# Patient Record
Sex: Female | Born: 1960 | Race: Black or African American | Hispanic: No | Marital: Married | Smoking: Never smoker
Health system: Southern US, Community
[De-identification: ages and names within clinical notes are randomized; demographics above are authoritative.]

## PROBLEM LIST (undated history)

## (undated) DIAGNOSIS — G43909 Migraine, unspecified, not intractable, without status migrainosus: Secondary | ICD-10-CM

## (undated) DIAGNOSIS — R112 Nausea with vomiting, unspecified: Secondary | ICD-10-CM

## (undated) DIAGNOSIS — Z9889 Other specified postprocedural states: Secondary | ICD-10-CM

## (undated) DIAGNOSIS — B369 Superficial mycosis, unspecified: Secondary | ICD-10-CM

## (undated) DIAGNOSIS — M1990 Unspecified osteoarthritis, unspecified site: Secondary | ICD-10-CM

## (undated) DIAGNOSIS — Z973 Presence of spectacles and contact lenses: Secondary | ICD-10-CM

## (undated) DIAGNOSIS — S83206A Unspecified tear of unspecified meniscus, current injury, right knee, initial encounter: Secondary | ICD-10-CM

## (undated) HISTORY — DX: Unspecified osteoarthritis, unspecified site: M19.90

## (undated) HISTORY — DX: Superficial mycosis, unspecified: B36.9

## (undated) HISTORY — DX: Migraine, unspecified, not intractable, without status migrainosus: G43.909

---

## 1992-11-26 HISTORY — PX: CHOLECYSTECTOMY: SHX55

## 1993-11-26 HISTORY — PX: KNEE ARTHROSCOPY: SUR90

## 2001-07-11 ENCOUNTER — Ambulatory Visit (HOSPITAL_COMMUNITY): Admission: RE | Admit: 2001-07-11 | Discharge: 2001-07-11 | Payer: Self-pay | Admitting: General Surgery

## 2001-07-11 ENCOUNTER — Encounter: Payer: Self-pay | Admitting: General Surgery

## 2001-11-13 ENCOUNTER — Encounter: Payer: Self-pay | Admitting: Obstetrics and Gynecology

## 2001-11-13 ENCOUNTER — Ambulatory Visit (HOSPITAL_COMMUNITY): Admission: RE | Admit: 2001-11-13 | Discharge: 2001-11-13 | Payer: Self-pay | Admitting: Obstetrics and Gynecology

## 2001-11-20 ENCOUNTER — Encounter: Payer: Self-pay | Admitting: Family Medicine

## 2001-11-20 ENCOUNTER — Ambulatory Visit (HOSPITAL_COMMUNITY): Admission: RE | Admit: 2001-11-20 | Discharge: 2001-11-20 | Payer: Self-pay | Admitting: Family Medicine

## 2002-01-27 ENCOUNTER — Other Ambulatory Visit: Admission: RE | Admit: 2002-01-27 | Discharge: 2002-01-27 | Payer: Self-pay | Admitting: Obstetrics and Gynecology

## 2002-03-26 ENCOUNTER — Encounter: Payer: Self-pay | Admitting: Family Medicine

## 2002-03-26 ENCOUNTER — Ambulatory Visit (HOSPITAL_COMMUNITY): Admission: RE | Admit: 2002-03-26 | Discharge: 2002-03-26 | Payer: Self-pay | Admitting: Family Medicine

## 2002-11-16 ENCOUNTER — Ambulatory Visit (HOSPITAL_COMMUNITY): Admission: RE | Admit: 2002-11-16 | Discharge: 2002-11-16 | Payer: Self-pay | Admitting: Obstetrics and Gynecology

## 2002-11-16 ENCOUNTER — Encounter: Payer: Self-pay | Admitting: Obstetrics and Gynecology

## 2003-07-30 ENCOUNTER — Ambulatory Visit (HOSPITAL_COMMUNITY): Admission: RE | Admit: 2003-07-30 | Discharge: 2003-07-30 | Payer: Self-pay | Admitting: Obstetrics & Gynecology

## 2003-07-30 HISTORY — PX: TUBAL LIGATION: SHX77

## 2003-11-18 ENCOUNTER — Ambulatory Visit (HOSPITAL_COMMUNITY): Admission: RE | Admit: 2003-11-18 | Discharge: 2003-11-18 | Payer: Self-pay | Admitting: Obstetrics and Gynecology

## 2004-11-21 ENCOUNTER — Ambulatory Visit (HOSPITAL_COMMUNITY): Admission: RE | Admit: 2004-11-21 | Discharge: 2004-11-21 | Payer: Self-pay | Admitting: Obstetrics and Gynecology

## 2006-02-07 ENCOUNTER — Ambulatory Visit (HOSPITAL_COMMUNITY): Admission: RE | Admit: 2006-02-07 | Discharge: 2006-02-07 | Payer: Self-pay | Admitting: Obstetrics and Gynecology

## 2006-11-26 HISTORY — PX: SHOULDER OPEN ROTATOR CUFF REPAIR: SHX2407

## 2007-06-13 ENCOUNTER — Ambulatory Visit (HOSPITAL_COMMUNITY): Admission: RE | Admit: 2007-06-13 | Discharge: 2007-06-13 | Payer: Self-pay | Admitting: Obstetrics & Gynecology

## 2007-06-19 ENCOUNTER — Ambulatory Visit: Payer: Self-pay | Admitting: Gastroenterology

## 2007-06-20 ENCOUNTER — Ambulatory Visit (HOSPITAL_COMMUNITY): Admission: RE | Admit: 2007-06-20 | Discharge: 2007-06-20 | Payer: Self-pay | Admitting: Gastroenterology

## 2007-08-27 ENCOUNTER — Ambulatory Visit: Payer: Self-pay | Admitting: Gastroenterology

## 2008-04-26 ENCOUNTER — Encounter (HOSPITAL_COMMUNITY): Admission: RE | Admit: 2008-04-26 | Discharge: 2008-05-26 | Payer: Self-pay | Admitting: Orthopedic Surgery

## 2008-05-31 ENCOUNTER — Encounter (HOSPITAL_COMMUNITY): Admission: RE | Admit: 2008-05-31 | Discharge: 2008-06-30 | Payer: Self-pay | Admitting: Orthopedic Surgery

## 2008-07-05 ENCOUNTER — Encounter (HOSPITAL_COMMUNITY): Admission: RE | Admit: 2008-07-05 | Discharge: 2008-08-04 | Payer: Self-pay | Admitting: Orthopedic Surgery

## 2008-08-05 ENCOUNTER — Encounter (HOSPITAL_COMMUNITY): Admission: RE | Admit: 2008-08-05 | Discharge: 2008-08-25 | Payer: Self-pay | Admitting: Orthopedic Surgery

## 2008-08-26 ENCOUNTER — Encounter (HOSPITAL_COMMUNITY): Admission: RE | Admit: 2008-08-26 | Discharge: 2008-09-25 | Payer: Self-pay | Admitting: Orthopedic Surgery

## 2008-09-29 ENCOUNTER — Ambulatory Visit (HOSPITAL_COMMUNITY): Admission: RE | Admit: 2008-09-29 | Discharge: 2008-09-29 | Payer: Self-pay | Admitting: Obstetrics and Gynecology

## 2008-12-29 ENCOUNTER — Other Ambulatory Visit: Admission: RE | Admit: 2008-12-29 | Discharge: 2008-12-29 | Payer: Self-pay | Admitting: Obstetrics and Gynecology

## 2009-09-29 ENCOUNTER — Encounter: Payer: Self-pay | Admitting: Family Medicine

## 2010-01-16 ENCOUNTER — Other Ambulatory Visit: Admission: RE | Admit: 2010-01-16 | Discharge: 2010-01-16 | Payer: Self-pay | Admitting: Obstetrics and Gynecology

## 2010-05-16 ENCOUNTER — Encounter: Payer: Self-pay | Admitting: Internal Medicine

## 2010-05-30 ENCOUNTER — Ambulatory Visit (HOSPITAL_COMMUNITY): Admission: RE | Admit: 2010-05-30 | Discharge: 2010-05-30 | Payer: Self-pay | Admitting: Internal Medicine

## 2010-05-30 ENCOUNTER — Ambulatory Visit: Payer: Self-pay | Admitting: Internal Medicine

## 2010-06-04 ENCOUNTER — Encounter: Payer: Self-pay | Admitting: Internal Medicine

## 2010-06-27 ENCOUNTER — Encounter: Payer: Self-pay | Admitting: Internal Medicine

## 2010-12-26 NOTE — Letter (Signed)
Summary: PATHOLOGY RESULTS  PATHOLOGY RESULTS   Imported By: Rexene Alberts 06/27/2010 16:04:09  _____________________________________________________________________  External Attachment:    Type:   Image     Comment:   External Document

## 2010-12-26 NOTE — Letter (Signed)
Summary: Patient Notice, Colon Biopsy Results  [facility]  [address]   [place], [place] [address]   Phone: [phone]  Fax: [phone]       June 04, 2010   Charlotte Rice [address] [place], [place]  [address] 1961-04-24    Dear Ms. Mullan,  I am pleased to inform you that the biopsies taken during your recent colonoscopy did not show any evidence of cancer upon pathologic examination.  Additional information/recommendations:  No further action is needed at this time.  Please follow-up with your primary care physician for your other healthcare needs.  You should have a repeat colonoscopy examination  in 10 years.  Please call us if you are having persistent problems or have questions about your condition that have not been fully answered at this time.  Sincerely,    R. Roetta Sessions MD, FACP [facility] Ph: [phone]    Fax: [phone]   Appended Document: Patient Notice, Colon Biopsy Results Letter mailed to pt.  Appended Document: Patient Notice, Colon Biopsy Results reminder in computer

## 2010-12-26 NOTE — Letter (Signed)
Summary: Internal Other Domingo Dimes  Internal Other Domingo Dimes   Imported By: Cloria Spring LPN 52/84/1324 40:10:27  _____________________________________________________________________  External Attachment:    Type:   Image     Comment:   External Document

## 2011-04-10 NOTE — Assessment & Plan Note (Signed)
NAMEMarland Kitchen  Charlotte Rice, Charlotte Rice               CHART#:  [id number]   DATE:  08/27/2007                       DOB:  15-May-1961   REFERRING PHYSICIAN:  Christin Bach, M.D.   PROBLEM LIST:  1. Recurrent right upper quadrant pain.  2. Cholecystectomy in 1991.  3. Right shoulder pain.   SUBJECTIVE:  Charlotte Rice is a 50 year old female who was seen and  evaluated in July 2008 secondary to right upper quadrant pain.  She  presents today with no complaints of nausea, vomiting, diarrhea, or  constipation.  She has one bowel movement a day.  She does use over-the-  counter Advil 3 to 4 times a day for right shoulder pain.  She denies  any rectal bleeding or black, tarry stools.  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  She describes the pain that occurs in her right abdomen as an onset of a  bad cramp that is exacerbated by certain jobs or moves that she makes.  She reports doing a lot of bending and lifting and picking up stuff.   OBJECTIVE:  VITAL SIGNS:  Weight 181 pounds (increased 5 pounds since  July 2008).  Height 5 feet.  Temperature 98.2.  Blood pressure 118/60.  Pulse 72.  GENERAL:  She is in no apparent distress, alert and oriented x4.  LUNGS:  Clear to auscultation bilaterally.  CARDIOVASCULAR:  Regular rhythm without murmur.  ABDOMEN:  Bowel sounds are present, soft, reproducible pain in the right  flank over her ribs.  No rebound or guarding in the abdomen.   ASSESSMENT:  Charlotte Rice is a 50 year old female whose right flank pain  is likely secondary to abdominal wall pain.  Her symptoms can be  reproduced on exam in clinic today.   Thank you for allowing me to see Charlotte Rice in consultation.  My  recommendations follow.   RECOMMENDATIONS:  1. Charlotte Rice may follow up with [place] as needed.  2. She may continue to use Advil as needed for her musculoskeletal      pain.  She may also      apply heat 3 times a day.  Her apparent  right chest wall strain      will probably persist due to the nature of the work that she does.       Kassie Mends, M.D.  Electronically Signed     SM/MEDQ  D:  08/27/2007  T:  08/28/2007  Job:  [id number]   cc:   Tilda Burrow, M.D.

## 2011-04-10 NOTE — Assessment & Plan Note (Signed)
Charlotte Rice, Charlotte Rice               CHART#:  [id number]   DATE:  06/19/2007                       DOB:  1960-12-08   REQUESTING PHYSICIAN:  Dr. Emelda Fear.   REASON FOR CONSULTATION:  Possible cholelithiasis/right upper quadrant  pain.   HISTORY OF PRESENT ILLNESS:  The patient is a 50 year old female who is  status post cholecystectomy approximately 9 years ago by Dr. Leona Carry.  She tells me, for a couple of months, she has had intermittent right  upper quadrant pain.  The pain is usually 7/10 on a pain scale.  It  lasts a few minutes.  She describes it like a muscle cramp.  The pain  is worse with movement.  It is definitely worse postprandially.  She  denies any nausea or vomiting.  Denies any anorexia or early satiety.  Denies any fevers or chills, or weight loss.  She denies any dark urine.  Denies any pruritus or jaundice.  She denies any dysphagia or  odynophagia, heartburn, or indigestion.  Her weight has remained stable.   She underwent abdominal ultrasound on 06/13/2007.  This study suggests  the presence of a 5 mm CBD stone with intra and extrahepatic biliary  ductal dilatation.  The CBD is not dilated in the head of the pancreas.  The wall of the duodenum adjacent to the head of the pancreas appears  slightly thickened, but is nonspecific.  Her LFTs from 06/11/2007 were  completely normal.  She also had a normal CBC and comprehensive  metabolic panel.   PAST MEDICAL/SURGICAL HISTORY:  Cholecystectomy in 1999.  She is unsure  whether she had cholelithiasis.  Right knee surgery in 1985.   CURRENT MEDICATIONS:  Topamax 150 mg nightly.  Loestrin 24 mcg FE daily.  Tramadol HCl 50 mg daily.  Etodolac ER 600 mg b.i.d.  Advil occasionally  p.r.n.   ALLERGIES:  No known drug allergies.   FAMILY HISTORY:  Noncontributory.   SOCIAL HISTORY:  The patient has 2 children ages 48 and 60.  She is a  Print production planner.  She denies tobacco, alcohol, or drug use.   REVIEW  OF SYSTEMS:  See HPI.  Otherwise, negative.   PHYSICAL EXAM:  VITAL SIGNS:  Weight 176 pounds, height 60 inches,  temperature 98.5, blood pressure 160/60, and pulse 66.  GENERAL:  The patient is a 50 year old African-American female who is  awake and alert, oriented, pleasant, and cooperative, in no acute  distress.  HEENT:  Pupils equal, round, and reactive to light.  Sclerae clear,  nonicteric.  Conjunctivae pink.  Oropharynx pink and moist without  lesions.  NECK:  Supple without evidence of mass or thyromegaly.  CHEST:  Heart regular rate and rhythm.  Normal S1, S2 without murmurs,  clicks, rubs, or gallops.  LUNGS:  Clear to auscultation bilaterally.  ABDOMEN:  Positive bowel sounds x4.  No bruits auscultated.  Soft.  Nontender, nondistended without palpable mass or hepatosplenomegaly.  No  rebound tenderness or guarding.  EXTREMITIES:  Without clubbing or edema bilaterally.  SKIN:  Warm and dry without any rash or jaundice.   IMPRESSION:  The patient is a 50 year old female with a couple-month  history of intermittent right upper quadrant pain.  Abdominal ultrasound  form 06/13/2007 shows possible presence of a 5 mm common bile duct stone  with intra  and extrahepatic biliary ductal dilatation, but there is no  elevation of LFT.  She is going to need further evaluation with MRCP to  evaluate whether she truly has choledocholithiasis, and in that event,  she would need ERCP.   PLAN:  1. MRCP in the near future.  2. I have offered pain medication.  She has declined.  3. Further recommendations pending MRCP.   We would like to thank Dr. Emelda Fear for allowing [place] to participate in  the care of this patient.       Lorenza Burton, N.P.  Electronically Signed     Kassie Mends, M.D.  Electronically Signed    KJ/MEDQ  D:  06/20/2007  T:  06/20/2007  Job:  [id number]   cc:   Tilda Burrow, M.D.

## 2011-04-13 NOTE — Op Note (Signed)
   NAME:  Charlotte Rice, Charlotte Rice                        ACCOUNT NO.:  [id number]   MEDICAL RECORD NO.:  [id number]                   PATIENT TYPE:  AMB   LOCATION:  DAY                                  FACILITY:  [facility]   PHYSICIAN:  Lazaro Arms, M.D.                DATE OF BIRTH:  1961/10/31   DATE OF PROCEDURE:  07/30/2003  DATE OF DISCHARGE:                                 OPERATIVE REPORT   PREOPERATIVE DIAGNOSES:  1. Parous female desires permanent sterilization.  2. Irregular bleeding on Micronor.   POSTOPERATIVE DIAGNOSES:  1. Parous female desires permanent sterilization.  2. Irregular bleeding on Micronor.   PROCEDURE:  Laparoscopic bilateral tubal ligation using electrocautery.   SURGEON:  Lazaro Arms, M.D.   ANESTHESIA:  General endotracheal.   FINDINGS:  The patient had normal uterus, tubes, and ovaries.  There were no  intraperitoneal abnormalities.   DESCRIPTION OF PROCEDURE:  The patient was taken to the operating room and  placed in the supine position.  She underwent general endotracheal  anesthesia and placed in the low lithotomy position.  The abdomen and vagina  were prepped in the usual sterile fashion.  A Hulka tenaculum was placed.  She was then draped.   An open laparoscopy was performed under direct visualization.  An incision  was made in the umbilicus and carried down to the fascia.  The fascia was  incised and the peritoneum was entered bluntly.  Under direct visualization,  a 10-11 obturator was placed.  It was just placed with nothing in it, and  then the video laparoscope confirmed the peritoneal cavity.  It was  insufflated.  Bilateral tubal ligation was performed using the  electrocautery unit in the distal isthmic ampullary region of each portion  of the tube.  There was good hemostasis.  An approximately 3-cm segment  bilaterally was cauterized.  The instruments were removed.  The gas was  allowed to escape.  The fascia was closed with a  single 0 Vicryl suture.  A  3-0 subcuticular suture was placed, and Dermabond was placed.  Marcaine 0.5%  with 1:200,000 epinephrine, 10 cc of local anesthetic, was placed.   The patient tolerated the procedure well.  She experienced no blood loss and  was taken to the recovery room in good and stable condition.  All counts  were correct.                                               Lazaro Arms, M.D.   Loraine Maple  D:  07/30/2003  T:  07/30/2003  Job:  [id number]

## 2011-04-27 ENCOUNTER — Other Ambulatory Visit: Payer: Self-pay | Admitting: Obstetrics and Gynecology

## 2011-04-27 DIAGNOSIS — Z139 Encounter for screening, unspecified: Secondary | ICD-10-CM

## 2011-05-01 ENCOUNTER — Ambulatory Visit (HOSPITAL_COMMUNITY)
Admission: RE | Admit: 2011-05-01 | Discharge: 2011-05-01 | Disposition: A | Discharge status: Home / Self Care | Payer: 59 | Source: Ambulatory Visit | Attending: Obstetrics and Gynecology | Admitting: Obstetrics and Gynecology

## 2011-05-01 DIAGNOSIS — Z1231 Encounter for screening mammogram for malignant neoplasm of breast: Secondary | ICD-10-CM | POA: Insufficient documentation

## 2011-05-01 DIAGNOSIS — Z139 Encounter for screening, unspecified: Secondary | ICD-10-CM

## 2011-07-27 ENCOUNTER — Encounter: Payer: Self-pay | Admitting: Family Medicine

## 2011-07-27 ENCOUNTER — Ambulatory Visit (INDEPENDENT_AMBULATORY_CARE_PROVIDER_SITE_OTHER): Payer: 59 | Admitting: Family Medicine

## 2011-07-27 VITALS — BP 120/78 | HR 72 | Resp 16 | Ht 60.0 in | Wt 191.1 lb

## 2011-07-27 DIAGNOSIS — B001 Herpesviral vesicular dermatitis: Secondary | ICD-10-CM

## 2011-07-27 DIAGNOSIS — B009 Herpesviral infection, unspecified: Secondary | ICD-10-CM

## 2011-07-27 DIAGNOSIS — J069 Acute upper respiratory infection, unspecified: Secondary | ICD-10-CM

## 2011-07-27 DIAGNOSIS — E669 Obesity, unspecified: Secondary | ICD-10-CM

## 2011-07-27 DIAGNOSIS — Z23 Encounter for immunization: Secondary | ICD-10-CM

## 2011-07-27 MED ORDER — ACYCLOVIR 5 % EX OINT: TOPICAL_OINTMENT | CUTANEOUS | Status: AC

## 2011-07-27 MED ORDER — FLUTICASONE PROPIONATE 50 MCG/ACT NA SUSP: 1.0000 | Freq: Two times a day (BID) | NASAL | Status: DC

## 2011-07-27 NOTE — Patient Instructions (Addendum)
Use  The flonase as needed 1 spray twice a day, you can also try nasal saline over the counter You have been given a Tetanus booster Please keep your PAP Smear appt, if they draw blood ask them to send me a copy I will get records from your previous doctors Use the anti-viral cream as prescribed Get your flu shot at work  I advise at least 30 minutes of aerobic exercise 5 days a week, low carb diet, watch fried and fatty foods If your blood work is overdue then we will call you with instructions You should take total of 1200mg  of calcium and 800IU of vitamin D daily. You can continue the fish oil and B12.

## 2011-07-27 NOTE — Progress Notes (Signed)
  Subjective:    Patient ID: Charlotte Rice, female    DOB: 1961-05-03, 50 y.o.   MRN: [id number]  HPI  Pt here to establish care, she has not had a PCP in 10 years, medications and history reviewed   Dr. Clarisse Gouge- Neurologist Migraine physician- currently not on any medications, previously on Topamax, Relapax  Dr. Cristino Martes Tree- due for PAP in October, Mammogram done in June    URI  2 weeks,ago- history of asthma as a child, continued nasal congestion, and cough with mild production,no fever, no N/V, no sick contacts   [employer]- gives flu shot yearly    Rash- often gets cold sores, asking for a topical cream for this, currently has a few bumps beneath chin that have come up since she has been sick, they do not itch.  Review of Systems  Per above  GEN- denies fatigue, fever, weight loss,weakness, +recent illness HEENT- denies eye drainage, change in vision, nasal discharge, CVS- denies chest pain, palpitations RESP- denies SOB, + cough,denies  wheeze ABD- denies N/V, change in stools, abd pain Neuro- + headache, denies dizziness, syncope, seizure activity       Objective:   Physical Exam GEN- NAD, alert and oriented x3 HEENT- PERRL, EOMI, non injected sclera, pink conjunctiva, MMM, oropharynx clear, TM clear bilat no effusion Neck- Supple, no thryomegaly, no LAD CVS- RRR, no murmur RESP-CTAB EXT- No edema Pulses- Radial, DP- 2+ Skin- small erythematous, maculaopapular rash on chin, small herpetic leision at vermillion border, no lesions in corner of mouth, no oral lesions       Assessment & Plan:

## 2011-07-29 ENCOUNTER — Encounter: Payer: Self-pay | Admitting: Family Medicine

## 2011-07-29 DIAGNOSIS — B001 Herpesviral vesicular dermatitis: Secondary | ICD-10-CM | POA: Insufficient documentation

## 2011-07-29 DIAGNOSIS — E669 Obesity, unspecified: Secondary | ICD-10-CM | POA: Insufficient documentation

## 2011-07-29 NOTE — Assessment & Plan Note (Signed)
Discussed weight,Pt did not seem that motivated today, will continue to address this , she is at risk for many co-morbidities

## 2011-07-29 NOTE — Assessment & Plan Note (Signed)
Supportive care, will give Flonase trial

## 2011-07-29 NOTE — Assessment & Plan Note (Signed)
Rash today, shows more viral etiology based on timing,will give Zovirax topical

## 2011-08-29 ENCOUNTER — Other Ambulatory Visit: Payer: Self-pay | Admitting: Adult Health

## 2011-08-29 ENCOUNTER — Other Ambulatory Visit (HOSPITAL_COMMUNITY)
Admission: RE | Admit: 2011-08-29 | Discharge: 2011-08-29 | Disposition: A | Discharge status: Home / Self Care | Payer: 59 | Source: Ambulatory Visit | Attending: Obstetrics and Gynecology | Admitting: Obstetrics and Gynecology

## 2011-08-29 DIAGNOSIS — Z01419 Encounter for gynecological examination (general) (routine) without abnormal findings: Secondary | ICD-10-CM | POA: Insufficient documentation

## 2012-02-21 ENCOUNTER — Ambulatory Visit (INDEPENDENT_AMBULATORY_CARE_PROVIDER_SITE_OTHER): Payer: 59 | Admitting: Family Medicine

## 2012-02-21 ENCOUNTER — Encounter: Payer: Self-pay | Admitting: Family Medicine

## 2012-02-21 VITALS — BP 110/70 | HR 58 | Resp 18 | Ht 60.0 in | Wt 189.1 lb

## 2012-02-21 DIAGNOSIS — R439 Unspecified disturbances of smell and taste: Secondary | ICD-10-CM | POA: Insufficient documentation

## 2012-02-21 DIAGNOSIS — E669 Obesity, unspecified: Secondary | ICD-10-CM

## 2012-02-21 NOTE — Patient Instructions (Signed)
I will refer you to [facility] ear nose throat  No new medications F/U as needed

## 2012-02-21 NOTE — Assessment & Plan Note (Signed)
Will refer her to ear nose and throat for evaluation I am not sure if she needs to be scoped or sinuses need to be looked at. She is otherwise healthy today I would not prescribe any medications.

## 2012-02-21 NOTE — Assessment & Plan Note (Signed)
Pt encouraged to work on [employer]

## 2012-02-21 NOTE — Progress Notes (Signed)
  Subjective:    Patient ID: Charlotte Rice, female    DOB: [birth year], 51 y.o.   MRN: [id number]  HPI  Continue to have problems with her taste and smell. I treated for her with flonase for upper respiratory problem in August of 2012. Since then she has not regain her sense of smell or taste. She gave an example of her father being a chain smoker and she is unable to smell of tobacco smoke. She also was cooking dinner and did not smell her food burning. She is unable to smell strong perfumes like she previously did her food has no taste with the exception of very spicy food she can taste that she is unable to taste the at this time.  She is now going on for 7 months with this problem. She otherwise feels good. She takes her supplements/vitamins  every now and then No cough, no CP, no SOB, no nasal drainage  Review of Systems  GEN- denies fatigue, fever, weight loss,weakness, recent illness HEENT- denies eye drainage, change in vision, nasal discharge, CVS- denies chest pain, palpitations RESP- denies SOB, cough, wheeze ABD- denies N/V, change in stools, abd pain Neuro- denies headache, dizziness, syncope, seizure activity       Objective:   Physical Exam GEN- NAD, alert and oriented x3 HEENT- PERRL, EOMI, non injected sclera, pink conjunctiva, MMM, oropharynx clear. TM clear bilat, nares clear Neck- Supple, no LAD CVS- RRR, no murmur RESP-CTAB EXT- No edema Pulses- Radial, DP- 2+        Assessment & Plan:

## 2012-03-04 ENCOUNTER — Ambulatory Visit
Admission: RE | Admit: 2012-03-04 | Discharge: 2012-03-04 | Disposition: A | Discharge status: Home / Self Care | Payer: 59 | Source: Ambulatory Visit | Attending: Otolaryngology | Admitting: Otolaryngology

## 2012-03-04 ENCOUNTER — Other Ambulatory Visit: Payer: Self-pay | Admitting: Otolaryngology

## 2012-03-04 ENCOUNTER — Other Ambulatory Visit (HOSPITAL_COMMUNITY)
Admission: RE | Admit: 2012-03-04 | Discharge: 2012-03-04 | Disposition: A | Discharge status: Home / Self Care | Payer: 59 | Source: Ambulatory Visit | Attending: Otolaryngology | Admitting: Otolaryngology

## 2012-03-04 DIAGNOSIS — E041 Nontoxic single thyroid nodule: Secondary | ICD-10-CM

## 2012-03-04 DIAGNOSIS — R430 Anosmia: Secondary | ICD-10-CM

## 2012-03-04 DIAGNOSIS — E049 Nontoxic goiter, unspecified: Secondary | ICD-10-CM | POA: Insufficient documentation

## 2012-09-11 ENCOUNTER — Other Ambulatory Visit: Payer: Self-pay | Admitting: Otolaryngology

## 2012-09-11 DIAGNOSIS — E041 Nontoxic single thyroid nodule: Secondary | ICD-10-CM

## 2012-09-16 ENCOUNTER — Ambulatory Visit
Admission: RE | Admit: 2012-09-16 | Discharge: 2012-09-16 | Disposition: A | Discharge status: Home / Self Care | Payer: 59 | Source: Ambulatory Visit | Attending: Otolaryngology | Admitting: Otolaryngology

## 2012-09-16 DIAGNOSIS — E041 Nontoxic single thyroid nodule: Secondary | ICD-10-CM

## 2012-12-09 ENCOUNTER — Other Ambulatory Visit: Payer: Self-pay | Admitting: Family Medicine

## 2012-12-09 DIAGNOSIS — Z205 Contact with and (suspected) exposure to viral hepatitis: Secondary | ICD-10-CM

## 2012-12-09 NOTE — Progress Notes (Signed)
I spoke with patient her daughter has been diagnosed with hepatitis C she would like to be checked as well. She and her husband will be checked for hepatitis C based on possible exposure and transmission to their child

## 2012-12-10 NOTE — Progress Notes (Signed)
Lab requisitions faxed.

## 2012-12-11 ENCOUNTER — Other Ambulatory Visit: Payer: Self-pay | Admitting: Family Medicine

## 2012-12-12 LAB — HEPATITIS C ANTIBODY: HCV Ab: NEGATIVE

## 2013-01-26 ENCOUNTER — Other Ambulatory Visit (HOSPITAL_COMMUNITY): Payer: Self-pay | Admitting: Obstetrics and Gynecology

## 2013-01-26 DIAGNOSIS — Z139 Encounter for screening, unspecified: Secondary | ICD-10-CM

## 2013-02-02 ENCOUNTER — Ambulatory Visit (HOSPITAL_COMMUNITY)
Admission: RE | Admit: 2013-02-02 | Discharge: 2013-02-02 | Disposition: A | Discharge status: Home / Self Care | Payer: 59 | Source: Ambulatory Visit | Attending: Obstetrics and Gynecology | Admitting: Obstetrics and Gynecology

## 2013-02-02 ENCOUNTER — Ambulatory Visit (HOSPITAL_COMMUNITY): Payer: 59

## 2013-02-02 DIAGNOSIS — Z1231 Encounter for screening mammogram for malignant neoplasm of breast: Secondary | ICD-10-CM | POA: Insufficient documentation

## 2013-02-02 DIAGNOSIS — Z139 Encounter for screening, unspecified: Secondary | ICD-10-CM

## 2013-02-27 ENCOUNTER — Ambulatory Visit
Admission: RE | Admit: 2013-02-27 | Discharge: 2013-02-27 | Disposition: A | Discharge status: Home / Self Care | Payer: 59 | Source: Ambulatory Visit | Attending: Emergency Medicine | Admitting: Emergency Medicine

## 2013-02-27 ENCOUNTER — Other Ambulatory Visit: Payer: Self-pay | Admitting: Emergency Medicine

## 2013-02-27 DIAGNOSIS — M25561 Pain in right knee: Secondary | ICD-10-CM

## 2013-07-01 ENCOUNTER — Other Ambulatory Visit (HOSPITAL_COMMUNITY)
Admission: RE | Admit: 2013-07-01 | Discharge: 2013-07-01 | Disposition: A | Discharge status: Home / Self Care | Payer: 59 | Source: Ambulatory Visit | Attending: Adult Health | Admitting: Adult Health

## 2013-07-01 ENCOUNTER — Ambulatory Visit (INDEPENDENT_AMBULATORY_CARE_PROVIDER_SITE_OTHER): Payer: 59 | Admitting: Adult Health

## 2013-07-01 ENCOUNTER — Encounter: Payer: Self-pay | Admitting: Adult Health

## 2013-07-01 VITALS — BP 112/80 | HR 70 | Ht 61.0 in | Wt 193.0 lb

## 2013-07-01 DIAGNOSIS — Z01419 Encounter for gynecological examination (general) (routine) without abnormal findings: Secondary | ICD-10-CM | POA: Insufficient documentation

## 2013-07-01 DIAGNOSIS — Z1151 Encounter for screening for human papillomavirus (HPV): Secondary | ICD-10-CM | POA: Insufficient documentation

## 2013-07-01 DIAGNOSIS — Z8669 Personal history of other diseases of the nervous system and sense organs: Secondary | ICD-10-CM

## 2013-07-01 DIAGNOSIS — Z1212 Encounter for screening for malignant neoplasm of rectum: Secondary | ICD-10-CM

## 2013-07-01 LAB — HEMOCCULT GUIAC POC 1CARD (OFFICE)

## 2013-07-01 NOTE — Progress Notes (Signed)
Patient ID: Charlotte Rice, female   DOB: February 17, 1961, 52 y.o.   MRN: [id number] History of Present Illness: Tasheema is a 52 year old black female married, in for a pap and physical.   Current Medications, Allergies, Past Medical History, Past Surgical History, Family History and Social History were reviewed in [employer] medical record.     Review of Systems: Patient denies any blurred vision, shortness of breath, chest pain, abdominal pain, problems with bowel movements, urination, or intercourse. She knee pains at times and she thinks her migraines are returning.no mood changes.    Physical Exam:BP 112/80  Pulse 70  Ht 5\' 1"  (1.549 m)  Wt 193 lb (87.544 kg)  BMI 36.49 kg/m2 General:  Well developed, well nourished, no acute distress Skin:  Warm and dry Neck:  Midline trachea, normal thyroid Lungs; Clear to auscultation bilaterally Breast:  No dominant palpable mass, retraction, or nipple discharge Cardiovascular: Regular rate and rhythm Abdomen:  Soft, non tender, no hepatosplenomegaly Pelvic:  External genitalia is normal in appearance.  The vagina is normal in appearance.  The cervix is bulbous.Pap performed with HPV.  Uterus is felt to be normal size, shape, and contour.  No                adnexal masses or tenderness noted. Rectal: Good sphincter tone, no polyps, or hemorrhoids felt.  Hemoccult negative. Extremities:  No swelling or varicosities noted Psych:  Alert and cooperative, seems happy   Impression: Yearly exam in postmenopausal female History of migraines    Plan: Physical in 1 year  Mammogram yearly Colonoscopy as per GI Follow up with headache doctor in [place]

## 2013-07-01 NOTE — Patient Instructions (Addendum)
physical in 1 year Mammogram yearly  

## 2013-11-09 ENCOUNTER — Encounter: Payer: Self-pay | Admitting: Family Medicine

## 2013-11-09 ENCOUNTER — Ambulatory Visit (INDEPENDENT_AMBULATORY_CARE_PROVIDER_SITE_OTHER): Payer: 59 | Admitting: Family Medicine

## 2013-11-09 VITALS — BP 100/80 | HR 78 | Temp 98.4°F | Resp 18 | Ht 60.8 in | Wt 190.0 lb

## 2013-11-09 DIAGNOSIS — T148 Other injury of unspecified body region: Secondary | ICD-10-CM

## 2013-11-09 DIAGNOSIS — W57XXXA Bitten or stung by nonvenomous insect and other nonvenomous arthropods, initial encounter: Secondary | ICD-10-CM

## 2013-11-09 DIAGNOSIS — R5381 Other malaise: Secondary | ICD-10-CM | POA: Insufficient documentation

## 2013-11-09 DIAGNOSIS — R0789 Other chest pain: Secondary | ICD-10-CM

## 2013-11-09 DIAGNOSIS — G43909 Migraine, unspecified, not intractable, without status migrainosus: Secondary | ICD-10-CM

## 2013-11-09 DIAGNOSIS — D649 Anemia, unspecified: Secondary | ICD-10-CM | POA: Insufficient documentation

## 2013-11-09 LAB — IRON AND TIBC
%SAT: 15 % — ABNORMAL LOW (ref 20–55)
Iron: 55 ug/dL (ref 42–145)
TIBC: 364 ug/dL (ref 250–470)
UIBC: 309 ug/dL (ref 125–400)

## 2013-11-09 LAB — COMPREHENSIVE METABOLIC PANEL
ALT: 34 U/L (ref 0–35)
Albumin: 3.9 g/dL (ref 3.5–5.2)
Alkaline Phosphatase: 120 U/L — ABNORMAL HIGH (ref 39–117)
BUN: 14 mg/dL (ref 6–23)
Calcium: 9.2 mg/dL (ref 8.4–10.5)
Chloride: 108 mEq/L (ref 96–112)
Creat: 1.03 mg/dL (ref 0.50–1.10)
Potassium: 4.2 mEq/L (ref 3.5–5.3)
Total Bilirubin: 0.3 mg/dL (ref 0.3–1.2)
Total Protein: 7.2 g/dL (ref 6.0–8.3)

## 2013-11-09 LAB — CBC WITH DIFFERENTIAL/PLATELET
Eosinophils Relative: 2 % (ref 0–5)
HCT: 35.9 % — ABNORMAL LOW (ref 36.0–46.0)
Lymphocytes Relative: 34 % (ref 12–46)
Lymphs Abs: 2.4 10*3/uL (ref 0.7–4.0)
Neutrophils Relative %: 55 % (ref 43–77)
RDW: 15.3 % (ref 11.5–15.5)

## 2013-11-09 MED ORDER — SUMATRIPTAN SUCCINATE 100 MG PO TABS: ORAL_TABLET | ORAL | Status: DC

## 2013-11-09 NOTE — Patient Instructions (Signed)
We will call with lab results EKG is normal Continue cortisone cream Imitrex for the migraines F/U as needed

## 2013-11-09 NOTE — Assessment & Plan Note (Addendum)
Her chest pain does not sound cardiac in nature. I will check her metabolic panel and TSH. I think this is more musculoskeletal pain. She's not giving symptoms of GI dysfunction. Possibly related to anemia

## 2013-11-09 NOTE — Assessment & Plan Note (Signed)
She's was found to be anemic and a red blood cross drive. I will check her hemoglobin again as well as her levels. We will then start her on appropriate iron therapy

## 2013-11-09 NOTE — Assessment & Plan Note (Signed)
Imitrex as needed ?

## 2013-11-09 NOTE — Progress Notes (Signed)
   Subjective:    Patient ID: Charlotte Rice, female    DOB: 1961-01-01, 52 y.o.   MRN: [id number]  HPI  Patient here with multiple concerns. She's had episodes of sharp chest pain on and off for the past few months. States that it comes to a sharp pain that lasts a few seconds and goes away. She does not take any medications. He can occur at rest or with exertion. She denies any shortness of breath, nausea vomiting or diaphoresis associated. She does note that she's had increased fatigue over the past few months. She was at the [employer] and they told her that her hemoglobin was in the 7 something. Denies any abnormal vaginal bleeding or rectal bleeding  Migraine disorder-she's not been on medications for some time now. She has not seen her neurologist in over a year. She's been using Excedrin Migraine which helps when she takes it at the onset of the headache. She's been having about one to 2 headaches a month although she has increased her caffeine intake.  Bitten by an insect on left arm at Thanksgiving she had 3 bites and a linear fashion they were quite red and swollen initially with some pus pockets. She used topical antibiotics and hydrocortisone cream they have decreased significantly but still itch some.      Review of Systems  GEN- denies fatigue, fever, weight loss,weakness, recent illness HEENT- denies eye drainage, change in vision, nasal discharge, CVS- + chest pain, palpitations RESP- denies SOB, cough, wheeze ABD- denies N/V, change in stools, abd pain GU- denies dysuria, hematuria, dribbling, incontinence MSK- denies joint pain, muscle aches, injury Neuro- denies headache, dizziness, syncope, seizure activity     Objective:   Physical Exam GEN- NAD, alert and oriented x3 HEENT- PERRL, EOMI, non injected sclera, pink conjunctiva, MMM, oropharynx clear Neck- Supple, no thyromegaly CVS- RRR, 2/6 SEM RESP-CTAB EXT- No edema Pulses- Radial, DP- 2+ Skin- Left  arm 3 small papular lesions linear fashion no erythema, scabs over, with mild hyperpigmentation 1 lesion to right of these lesions EKG-  NSR, no ST changes       Assessment & Plan:

## 2013-11-09 NOTE — Assessment & Plan Note (Signed)
No sign of superinfection the lesions are actually healing up. She can continue topical cortisone for the itching

## 2013-11-09 NOTE — Assessment & Plan Note (Signed)
Labs to be checked. Encourage weight loss and healthy eating

## 2013-11-10 LAB — TSH: TSH: 2.642 u[IU]/mL (ref 0.350–4.500)

## 2013-11-11 ENCOUNTER — Telehealth: Payer: Self-pay | Admitting: Family Medicine

## 2013-11-11 NOTE — Telephone Encounter (Signed)
Pt aware of results 

## 2013-11-11 NOTE — Telephone Encounter (Signed)
Pt is calling about test results Call back number is [phone]

## 2013-12-02 ENCOUNTER — Encounter: Payer: Self-pay | Admitting: Family Medicine

## 2014-01-29 ENCOUNTER — Ambulatory Visit (INDEPENDENT_AMBULATORY_CARE_PROVIDER_SITE_OTHER): Payer: 59 | Admitting: Family Medicine

## 2014-01-29 ENCOUNTER — Encounter: Payer: Self-pay | Admitting: Family Medicine

## 2014-01-29 VITALS — BP 118/62 | HR 76 | Temp 98.2°F | Resp 14 | Ht 60.25 in | Wt 185.0 lb

## 2014-01-29 DIAGNOSIS — K529 Noninfective gastroenteritis and colitis, unspecified: Secondary | ICD-10-CM

## 2014-01-29 DIAGNOSIS — K5289 Other specified noninfective gastroenteritis and colitis: Secondary | ICD-10-CM

## 2014-01-29 MED ORDER — PROMETHAZINE HCL 12.5 MG PO TABS
12.5000 mg | ORAL_TABLET | Freq: Three times a day (TID) | ORAL | Status: DC | PRN
Start: 2014-01-29 — End: 2015-04-06

## 2014-01-29 NOTE — Patient Instructions (Signed)
Take phenergan for nausea Schedule an eye appt  F/U as needed

## 2014-01-31 ENCOUNTER — Encounter: Payer: Self-pay | Admitting: Family Medicine

## 2014-01-31 NOTE — Progress Notes (Signed)
Patient ID: Charlotte Rice, female   DOB: [birth year], 53 y.o.   MRN: [id number]   Subjective:    Patient ID: Charlotte Rice, female    DOB: May 04, 1961, 53 y.o.   MRN: [id number]  Patient presents for illness  Patient here with nausea, upset stomach loose stools that started yesterday. She also had some mild dizziness and headache which is now improved. She was sent home from work with her symptoms. Yesterday. She's not had any fever or actual vomiting. She's had very few loose stools over the past 24 hours. She's able to tolerate some food including ginger ale. Her headache was not the same as her typical migraines since she did not take any medications.   Review Of Systems:  GEN- denies fatigue, fever, weight loss,weakness, recent illness HEENT- denies eye drainage, change in vision, nasal discharge, CVS- denies chest pain, palpitations RESP- denies SOB, cough, wheeze ABD- + N/V, +change in stools, abd pain Neuro- denies headache, dizziness, syncope, seizure activity       Objective:    BP 118/62  Pulse 76  Temp(Src) 98.2 F (36.8 C)  Resp 14  Ht 5' 0.25" (1.53 m)  Wt 185 lb (83.915 kg)  BMI 35.85 kg/m2 GEN- NAD, alert and oriented x3, non toxic appearing HEENT- PERRL, EOMI, non injected sclera, pink conjunctiva, MMM, oropharynx clear CVS- RRR, no murmur RESP-CTAB ABD-NABS,soft,NT,ND EXT- No edema Pulses- Radial 2+        Assessment & Plan:      Problem List Items Addressed This Visit   None    Visit Diagnoses   Gastroenteritis    -  Primary    Phenergan given. Her exam is benign I think that she will improve over the weekend without any other intervention. Push fluids       Note: This dictation was prepared with Dragon dictation along with smaller phrase technology. Any transcriptional errors that result from this process are unintentional.

## 2014-04-29 ENCOUNTER — Telehealth: Payer: Self-pay | Admitting: *Deleted

## 2014-04-29 NOTE — Telephone Encounter (Signed)
Pt called stating that she woke up sometime this am with symptoms of SOB,sweat, vomiting, passed out and dont know how or when she got there in bathroom, pain and tender L side ribcage, and can not get comfortable, today she is feeling strange with a Headache, I suggested pt go to ED or UC she is going to have the nurse from her job to check her BP and see what it is and if she need to go to ED she will. Pt is scheduled to come in on 04/30/14 to be checked if she does not go to ED. I stressed to pt that she needed to go to ED so that they can evaluate her instead of here.

## 2014-04-30 ENCOUNTER — Encounter: Payer: Self-pay | Admitting: Family Medicine

## 2014-04-30 ENCOUNTER — Ambulatory Visit (INDEPENDENT_AMBULATORY_CARE_PROVIDER_SITE_OTHER): Payer: 59 | Admitting: Family Medicine

## 2014-04-30 VITALS — BP 138/68 | HR 60 | Temp 98.5°F | Resp 14 | Ht 60.5 in | Wt 188.0 lb

## 2014-04-30 DIAGNOSIS — K2970 Gastritis, unspecified, without bleeding: Secondary | ICD-10-CM | POA: Insufficient documentation

## 2014-04-30 DIAGNOSIS — K2990 Gastroduodenitis, unspecified, without bleeding: Secondary | ICD-10-CM

## 2014-04-30 DIAGNOSIS — R109 Unspecified abdominal pain: Secondary | ICD-10-CM | POA: Insufficient documentation

## 2014-04-30 LAB — CBC WITH DIFFERENTIAL/PLATELET
BASOS ABS: 0 10*3/uL (ref 0.0–0.1)
Basophils Relative: 0 % (ref 0–1)
EOS ABS: 0.2 10*3/uL (ref 0.0–0.7)
Eosinophils Relative: 3 % (ref 0–5)
HCT: 34.9 % — ABNORMAL LOW (ref 36.0–46.0)
Hemoglobin: 11.7 g/dL — ABNORMAL LOW (ref 12.0–15.0)
Lymphocytes Relative: 30 % (ref 12–46)
Lymphs Abs: 2.2 10*3/uL (ref 0.7–4.0)
MCH: 28.5 pg (ref 26.0–34.0)
MCHC: 33.5 g/dL (ref 30.0–36.0)
MCV: 84.9 fL (ref 78.0–100.0)
Monocytes Absolute: 0.5 10*3/uL (ref 0.1–1.0)
Monocytes Relative: 7 % (ref 3–12)
NEUTROS PCT: 60 % (ref 43–77)
Neutro Abs: 4.3 10*3/uL (ref 1.7–7.7)
PLATELETS: 294 10*3/uL (ref 150–400)
RBC: 4.11 MIL/uL (ref 3.87–5.11)
RDW: 15.8 % — ABNORMAL HIGH (ref 11.5–15.5)
WBC: 7.2 10*3/uL (ref 4.0–10.5)

## 2014-04-30 LAB — LIPASE: Lipase: 38 U/L (ref 0–75)

## 2014-04-30 LAB — COMPREHENSIVE METABOLIC PANEL
ALK PHOS: 122 U/L — AB (ref 39–117)
ALT: 96 U/L — ABNORMAL HIGH (ref 0–35)
AST: 63 U/L — ABNORMAL HIGH (ref 0–37)
Albumin: 4 g/dL (ref 3.5–5.2)
BILIRUBIN TOTAL: 0.3 mg/dL (ref 0.2–1.2)
BUN: 14 mg/dL (ref 6–23)
CO2: 27 mEq/L (ref 19–32)
Calcium: 9.4 mg/dL (ref 8.4–10.5)
Chloride: 102 mEq/L (ref 96–112)
Creat: 1.08 mg/dL (ref 0.50–1.10)
Glucose, Bld: 80 mg/dL (ref 70–99)
Potassium: 4 mEq/L (ref 3.5–5.3)
Sodium: 142 mEq/L (ref 135–145)
Total Protein: 7.3 g/dL (ref 6.0–8.3)

## 2014-04-30 MED ORDER — OMEPRAZOLE 40 MG PO CPDR: 40.0000 mg | DELAYED_RELEASE_CAPSULE | Freq: Every day | ORAL | Status: DC

## 2014-04-30 NOTE — Assessment & Plan Note (Signed)
Of concern to her abdominal pain is related to possible gastritis or acid reflux. She has had episodes like this in the past after eating. I will check her baseline labs. Her exam today is overall benign

## 2014-04-30 NOTE — Progress Notes (Signed)
Patient ID: Charlotte Rice, female   DOB: 19-May-1961, 53 y.o.   MRN: [id number]   Subjective:    Patient ID: Charlotte Rice, female    DOB: Nov 21, 1961, 53 y.o.   MRN: [id number]  Patient presents for abdominal pain  patient here with left-sided abdominal pain. States that Wednesday night she woke up in the middle of night with an urge to vomit she also had severe left upper part and epigastric pain. She laid on the floor for about 20 minutes and he was able to get up and was back to bed. At that time she also had sweats. She does murmur having a couple of episodes of this after eating however they did not result in vomiting. She's not tried any medications for it. She often eats a little late and some heavy meals. She did go to work yesterday and today he has not had any difficulties or any significant pain since then. She does have some soreness mostly near her left ribs    Review Of Systems:  GEN- denies fatigue, fever, weight loss,weakness, recent illness HEENT- denies eye drainage, change in vision, nasal discharge, CVS- denies chest pain, palpitations RESP- denies SOB, cough, wheeze ABD- + N/V, change in stools,+ abd pain GU- denies dysuria, hematuria, dribbling, incontinence MSK- denies joint pain, muscle aches, injury Neuro- denies headache, dizziness, syncope, seizure activity       Objective:    BP 138/68  Pulse 60  Temp(Src) 98.5 F (36.9 C) (Oral)  Resp 14  Ht 5' 0.5" (1.537 m)  Wt 188 lb (85.276 kg)  BMI 36.10 kg/m2 GEN- NAD, alert and oriented x3 HEENT- PERRL, EOMI, non injected sclera, pink conjunctiva, MMM, oropharynx clear Neck- Supple, no LAD CVS- RRR, no murmur RESP-CTAB ABD-NABS,soft,mild TTP over LUQ, no HSM,ND, no rebound, no gaurding, no CVA tenderness MSK- mild TTP over lower left ribs, EXT- No edema Pulses- Radial 2+        Assessment & Plan:      Problem List Items Addressed This Visit   Gastritis     Treat with PPI, check labs and H  pylori    Abdominal pain, unspecified site - Primary     Of concern to her abdominal pain is related to possible gastritis or acid reflux. She has had episodes like this in the past after eating. I will check her baseline labs. Her exam today is overall benign    Relevant Orders      CBC with Differential      Comprehensive metabolic panel      Helicobacter pylori abs-IgG+IgA, bld      Lipase      Note: This dictation was prepared with Dragon dictation along with smaller phrase technology. Any transcriptional errors that result from this process are unintentional.

## 2014-04-30 NOTE — Assessment & Plan Note (Signed)
Treat with PPI, check labs and H pylori

## 2014-04-30 NOTE — Patient Instructions (Signed)
I will call with lab results Start the prilosec  Go to ER if not better Note for work to return today  F/U as needed

## 2014-05-03 ENCOUNTER — Other Ambulatory Visit: Payer: Self-pay | Admitting: *Deleted

## 2014-05-03 DIAGNOSIS — R7989 Other specified abnormal findings of blood chemistry: Secondary | ICD-10-CM

## 2014-05-03 DIAGNOSIS — R945 Abnormal results of liver function studies: Principal | ICD-10-CM

## 2014-05-05 ENCOUNTER — Other Ambulatory Visit: Payer: Self-pay | Admitting: Family Medicine

## 2014-05-05 LAB — HELICOBACTER PYLORI ABS-IGG+IGA, BLD
H Pylori IgG: 0.46 {ISR}
HELICOBACTER PYLORI AB, IGA: 9.1 U/mL — ABNORMAL HIGH (ref <9.0)

## 2014-05-06 LAB — COMPREHENSIVE METABOLIC PANEL
ALT: 31 U/L (ref 0–35)
AST: 21 U/L (ref 0–37)
Albumin: 4.3 g/dL (ref 3.5–5.2)
Alkaline Phosphatase: 115 U/L (ref 39–117)
BUN: 19 mg/dL (ref 6–23)
CHLORIDE: 101 meq/L (ref 96–112)
CO2: 26 meq/L (ref 19–32)
CREATININE: 1.27 mg/dL — AB (ref 0.50–1.10)
Calcium: 9.3 mg/dL (ref 8.4–10.5)
Glucose, Bld: 82 mg/dL (ref 70–99)
Potassium: 4 mEq/L (ref 3.5–5.3)
Sodium: 138 mEq/L (ref 135–145)
Total Bilirubin: 0.3 mg/dL (ref 0.2–1.2)
Total Protein: 7.5 g/dL (ref 6.0–8.3)

## 2014-05-06 LAB — CBC WITH DIFFERENTIAL/PLATELET
Basophils Absolute: 0 10*3/uL (ref 0.0–0.1)
Basophils Relative: 0 % (ref 0–1)
EOS PCT: 3 % (ref 0–5)
Eosinophils Absolute: 0.3 10*3/uL (ref 0.0–0.7)
HEMATOCRIT: 35.1 % — AB (ref 36.0–46.0)
Hemoglobin: 11.8 g/dL — ABNORMAL LOW (ref 12.0–15.0)
LYMPHS ABS: 2.7 10*3/uL (ref 0.7–4.0)
LYMPHS PCT: 30 % (ref 12–46)
MCH: 28.5 pg (ref 26.0–34.0)
MCHC: 33.6 g/dL (ref 30.0–36.0)
MCV: 84.8 fL (ref 78.0–100.0)
Monocytes Absolute: 0.7 10*3/uL (ref 0.1–1.0)
Monocytes Relative: 8 % (ref 3–12)
NEUTROS ABS: 5.3 10*3/uL (ref 1.7–7.7)
Neutrophils Relative %: 59 % (ref 43–77)
PLATELETS: 307 10*3/uL (ref 150–400)
RBC: 4.14 MIL/uL (ref 3.87–5.11)
RDW: 15.7 % — ABNORMAL HIGH (ref 11.5–15.5)
WBC: 8.9 10*3/uL (ref 4.0–10.5)

## 2014-05-06 LAB — LIPASE: LIPASE: 56 U/L (ref 0–75)

## 2014-05-07 LAB — HELICOBACTER PYLORI ABS-IGG+IGA, BLD
H Pylori IgG: 0.46 {ISR}
HELICOBACTER PYLORI AB, IGA: 12 U/mL — AB (ref <9.0)

## 2014-05-07 LAB — HEPATITIS PANEL, ACUTE
HCV Ab: NEGATIVE
HEP B C IGM: NONREACTIVE
Hep A IgM: NONREACTIVE
Hepatitis B Surface Ag: NEGATIVE

## 2014-08-10 ENCOUNTER — Ambulatory Visit (HOSPITAL_COMMUNITY)
Admission: RE | Admit: 2014-08-10 | Discharge: 2014-08-10 | Disposition: A | Discharge status: Home / Self Care | Payer: 59 | Source: Ambulatory Visit | Attending: Family Medicine | Admitting: Family Medicine

## 2014-08-10 ENCOUNTER — Ambulatory Visit (INDEPENDENT_AMBULATORY_CARE_PROVIDER_SITE_OTHER): Payer: 59 | Admitting: Family Medicine

## 2014-08-10 ENCOUNTER — Encounter: Payer: Self-pay | Admitting: Family Medicine

## 2014-08-10 VITALS — BP 144/84 | HR 72 | Temp 98.3°F | Resp 12 | Ht 60.0 in | Wt 186.0 lb

## 2014-08-10 DIAGNOSIS — M47817 Spondylosis without myelopathy or radiculopathy, lumbosacral region: Secondary | ICD-10-CM | POA: Diagnosis not present

## 2014-08-10 DIAGNOSIS — M545 Low back pain, unspecified: Secondary | ICD-10-CM

## 2014-08-10 DIAGNOSIS — M4120 Other idiopathic scoliosis, site unspecified: Secondary | ICD-10-CM | POA: Insufficient documentation

## 2014-08-10 MED ORDER — CYCLOBENZAPRINE HCL 10 MG PO TABS: 10.0000 mg | ORAL_TABLET | Freq: Three times a day (TID) | ORAL | Status: DC | PRN

## 2014-08-10 MED ORDER — METHYLPREDNISOLONE (PAK) 4 MG PO TABS: ORAL_TABLET | ORAL | Status: DC

## 2014-08-10 NOTE — Progress Notes (Signed)
Patient ID: Charlotte Rice, female   DOB: Apr 06, 1961, 53 y.o.   MRN: [id number]   Subjective:    Patient ID: Charlotte Rice, female    DOB: [birth year], 53 y.o.   MRN: [id number]  Patient presents for L lower back pain  Patient here with left lower back pain for the past 2 weeks it is actually improved some. She denies any specific injury but does a lot of repetitive movements at work. Denies any change of bowel or bladder denies any paresthesias in her lower extremities. She has been using AdvilTylenol which help some. 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   Review Of Systems:  GEN- denies fatigue, fever, weight loss,weakness, recent illness HEENT- denies eye drainage, change in vision, nasal discharge, CVS- denies chest pain, palpitations RESP- denies SOB, cough, wheeze ABD- denies N/V, change in stools, abd pain GU- denies dysuria, hematuria, dribbling, incontinence MSK- + joint pain, muscle aches, injury Neuro- denies headache, dizziness, syncope, seizure activity       Objective:    BP 144/84  Pulse 72  Temp(Src) 98.3 F (36.8 C) (Oral)  Resp 12  Ht 5' (1.524 m)  Wt 186 lb (84.369 kg)  BMI 36.33 kg/m2 GEN- NAD, alert and oriented x3 MSK- Mild TTP lumbar spine, + tenderness, spasm of left paraspinals, neg SLR, pain with Flexion and extension of back, mild pain left side with rotation, HIP FROM, knees- Good ROM, no effusion, normal inspection EXT- No edema Pulses- Radial 2+        Assessment & Plan:      Problem List Items Addressed This Visit   None    Visit Diagnoses   Left-sided low back pain without sciatica    -  Primary    Obtain x-ray of L-spine patient request. I think that she likely has some mild degenerative changes based on her other joints. I will give her a Medrol Dosepak and flexeril at bedtime    Relevant Medications       Methylprednisolone (MEDROL PAK) 4 mg po tab       cyclobenzaprine (FLEXERIL) tablet    Other Relevant Orders        DG Lumbar Spine Complete       Note: This dictation was prepared with Dragon dictation along with smaller phrase technology. Any transcriptional errors that result from this process are unintentional.

## 2014-08-10 NOTE — Patient Instructions (Signed)
Xray of spine to be done Take Medrol dosepak Hold the advil and ibuprofen Flexeril at bedtime F/u as needed

## 2014-09-09 ENCOUNTER — Other Ambulatory Visit: Payer: Self-pay | Admitting: Family Medicine

## 2014-09-09 NOTE — Telephone Encounter (Signed)
Refill appropriate and filled per protocol. 

## 2014-09-27 ENCOUNTER — Encounter: Payer: Self-pay | Admitting: Family Medicine

## 2014-10-04 ENCOUNTER — Other Ambulatory Visit: Payer: Self-pay | Admitting: Family Medicine

## 2014-10-04 NOTE — Telephone Encounter (Signed)
Okay to refill? 

## 2014-10-04 NOTE — Telephone Encounter (Signed)
Prescription sent to pharmacy.

## 2014-10-04 NOTE — Telephone Encounter (Signed)
Ok to refill??  Last office visit 08/10/2014.  Last refill 09/09/2014.

## 2015-01-05 ENCOUNTER — Ambulatory Visit (INDEPENDENT_AMBULATORY_CARE_PROVIDER_SITE_OTHER): Payer: 59 | Admitting: Family Medicine

## 2015-01-05 ENCOUNTER — Encounter: Payer: Self-pay | Admitting: Family Medicine

## 2015-01-05 VITALS — BP 130/76 | HR 78 | Temp 98.5°F | Resp 16 | Ht 60.0 in | Wt 188.0 lb

## 2015-01-05 DIAGNOSIS — D489 Neoplasm of uncertain behavior, unspecified: Secondary | ICD-10-CM

## 2015-01-05 NOTE — Patient Instructions (Addendum)
Return on Next Friday for suture removal and for results of mole testMole Excision Your caregiver has removed (excised) a mole. Most moles are benign (non cancerous). Some moles may change over time and require biopsy (tissue sample) or removal. The mole usually is removed by shaving or cutting it from the skin. You will have stitches in your skin if the mole is large. A small mole, or one that is shaved off, may require only a small bandage. Your caregiver will send a piece of the mole to the laboratory (pathology) to examine it under a microscope for signs of cancer. Make sure you get your biopsy results when you return for your follow-up visit. Call if there is no return visit. HOME CARE INSTRUCTIONS   If the biopsied area was the arm or leg, keep it raised (above the level of your heart) to decrease pain and swelling, if you are having any.  Keep the wound and dressing clean and dry. Clean as necessary.  If the dressing gets wet, remove it slowly and carefully. If it sticks, use warm, soapy water to gently loosen it. Pat the area dry with a clean towel before putting on another dressing.  Return in 7 days or as directed to have your sutures (stitches) removed.  Call in 3 to 4 days, or as directed, for the results of your biopsy. SEEK IMMEDIATE MEDICAL CARE IF:   You have a fever.  You have excess blood soaking through the dressing.  You have increasing pain and swelling in the wound.  You have numbness or swelling below the wound.  You have redness, swelling, pus, a bad smell, or red streaks coming away from the wound, or any other signs of infection. MAKE SURE YOU:   Understand these instructions.  Will watch your condition.  Will get help right away if you are not doing well or get worse. Document Released: 11/09/2000 Document Revised: 02/04/2012 Document Reviewed: 10/15/2007 [facility] Patient Information 2015 [place], [place]. This information is not intended to replace advice  given to you by your health care provider. Make sure you discuss any questions you have with your health care provider.

## 2015-01-05 NOTE — Progress Notes (Signed)
Patient ID: Charlotte Rice, female   DOB: [birth year], 54 y.o.   MRN: [id number]   Subjective:    Patient ID: Charlotte Rice, female    DOB: Nov 18, 1961, 54 y.o.   MRN: [id number]  Patient presents for Mole Removal  patient here for mole removal. She has a large mole in her posterior right thigh which is impressive for many years has been irritation in her in the top layer has been scratching SHE has not had any bleeding but has had some mild discomfort. She also has another flat mole on the right posterior thigh it has not changed in size or color but she has tenderness and is very sensitive and she would like to have this removed.    Review Of Systems:  GEN- denies fatigue, fever, weight loss,weakness, recent illness HEENT- denies eye drainage, change in vision, nasal discharge, CVS- denies chest pain, palpitations RESP- denies SOB, cough, wheeze ABD- denies N/V, change in stools, abd pain GU- denies dysuria, hematuria, dribbling, incontinence MSK- denies joint pain, muscle aches, injury Neuro- denies headache, dizziness, syncope, seizure activity       Objective:    BP 130/76 mmHg  Pulse 78  Temp(Src) 98.5 F (36.9 C) (Oral)  Resp 16  Ht 5' (1.524 m)  Wt 188 lb (85.276 kg)  BMI 36.72 kg/m2 GEN- NAD, alert and oriented x3 Skin- Right post thigh- large raised circular lesion, with hyperpigmentation on edges, more white in center,thick dense tissue quality  1.5 x 1cm  Left post thigh- flat hyperpigmented macule- mild TTP, no erythema 1cm x 0.5cm  Procedure- Mole removal excision/ shave Procedure explained to patient questions answered benefits and risks discussed verbal consent obtained. Antiseptic-Betadine Anesthesia-lidocaine 1% with epinephrine Shave biopsy of left leg Excisional biopsy right leg- 5 sutures interrupted with 4-0 ethilon Minimal blood loss, drysol applied to left lg lesion due to persistent oozing Patient tolerated procedure well Bandage applied      Assessment & Plan:      Problem List Items Addressed This Visit    None    Visit Diagnoses    Neoplasm of uncertain behavior    -  Primary    s/p neoplams removal, concern for the large raised lesion, await pathology, suture removal in 10 days    Relevant Orders    Pathology Report       Note: This dictation was prepared with Dragon dictation along with smaller phrase technology. Any transcriptional errors that result from this process are unintentional.

## 2015-01-11 LAB — PATHOLOGY

## 2015-01-14 ENCOUNTER — Ambulatory Visit (INDEPENDENT_AMBULATORY_CARE_PROVIDER_SITE_OTHER): Payer: 59 | Admitting: Family Medicine

## 2015-01-14 ENCOUNTER — Encounter: Payer: Self-pay | Admitting: Family Medicine

## 2015-01-14 VITALS — BP 126/72 | HR 70 | Temp 98.5°F | Resp 16 | Ht 60.0 in | Wt 188.0 lb

## 2015-01-14 DIAGNOSIS — L821 Other seborrheic keratosis: Secondary | ICD-10-CM

## 2015-01-14 DIAGNOSIS — D2371 Other benign neoplasm of skin of right lower limb, including hip: Secondary | ICD-10-CM

## 2015-01-14 NOTE — Patient Instructions (Signed)
Your skin lesions were benign No further biopsy needed F/U as needed

## 2015-01-14 NOTE — Progress Notes (Signed)
Patient ID: Charlotte Rice, female   DOB: August 28, 1961, 54 y.o.   MRN: [id number]   Subjective:    Patient ID: Charlotte Rice, female    DOB: 1961-11-25, 54 y.o.   MRN: [id number]  Patient presents for Suture Removal  Patient here for suture removal from her biopsies. The left thigh was a seborrheic keratoses the right posterior thigh was a large dermatofibroma. She has no concerns today. Has not had any drainage with erythema at the incision sites   Review Of Systems:  GEN- denies fatigue, fever, weight loss,weakness, recent illness HEENT- denies eye drainage, change in vision, nasal discharge, CVS- denies chest pain, palpitations RESP- denies SOB, cough, wheeze ABD- denies N/V, change in stools, abd pain GU- denies dysuria, hematuria, dribbling, incontinence MSK- denies joint pain, muscle aches, injury Neuro- denies headache, dizziness, syncope, seizure activity       Objective:    BP 126/72 mmHg  Pulse 70  Temp(Src) 98.5 F (36.9 C) (Oral)  Resp 16  Ht 5' (1.524 m)  Wt 188 lb (85.276 kg)  BMI 36.72 kg/m2 GEN- NAD, alert and oriented x3  Right posterior thigh 4 sutures removed incision dry clean and intact Steri-Strips applied.       Assessment & Plan:      Problem List Items Addressed This Visit    None    Visit Diagnoses    Dermatofibroma of right thigh    -  Primary    Benign lesions, sutues removed, discussed path with pt,  nothing further needed     Seborrheic keratoses           Note: This dictation was prepared with Dragon dictation along with smaller phrase technology. Any transcriptional errors that result from this process are unintentional.

## 2015-04-06 ENCOUNTER — Encounter: Payer: Self-pay | Admitting: Family Medicine

## 2015-04-06 ENCOUNTER — Ambulatory Visit (INDEPENDENT_AMBULATORY_CARE_PROVIDER_SITE_OTHER): Payer: 59 | Admitting: Family Medicine

## 2015-04-06 VITALS — BP 110/80 | HR 80 | Temp 98.8°F | Resp 16 | Ht 60.0 in | Wt 188.0 lb

## 2015-04-06 DIAGNOSIS — H8113 Benign paroxysmal vertigo, bilateral: Secondary | ICD-10-CM | POA: Diagnosis not present

## 2015-04-06 MED ORDER — MECLIZINE HCL 25 MG PO TABS: 25.0000 mg | ORAL_TABLET | Freq: Three times a day (TID) | ORAL | Status: DC | PRN

## 2015-04-06 MED ORDER — PROMETHAZINE HCL 12.5 MG PO TABS: 12.5000 mg | ORAL_TABLET | Freq: Three times a day (TID) | ORAL | Status: DC | PRN

## 2015-04-06 NOTE — Progress Notes (Signed)
Patient ID: Charlotte Rice, female   DOB: 17-Dec-1960, 54 y.o.   MRN: [id number]   Subjective:    Patient ID: Charlotte Rice, female    DOB: [birth year], 54 y.o.   MRN: [id number]  Patient presents for Vertigo  Pt here with dizzy spells, on and off for many months, she has used some phenergan I gave her in the past which helps when she sleeps it off. Has had 2 episodes at work where she gets dizzy from leaning over or moving quickly and every starts spinning she gets nauseas and has had emesis with it. Occurs at home to if she moves to quick. No HA accompanied, no sinus issues, no new meds no injury to head. Has seen ortho recently due to pinched nerve in neck also told she has carpal tunnel in right hand, had steroid injection and wearing brace currently   Review Of Systems:  GEN- denies fatigue, fever, weight loss,weakness, recent illness HEENT- denies eye drainage, change in vision, nasal discharge, CVS- denies chest pain, palpitations RESP- denies SOB, cough, wheeze ABD- denies N/V, change in stools, abd pain GU- denies dysuria, hematuria, dribbling, incontinence MSK- denies joint pain, muscle aches, injury Neuro- denies headache, +dizziness, syncope, seizure activity       Objective:    BP 110/80 mmHg  Pulse 80  Temp(Src) 98.8 F (37.1 C) (Oral)  Resp 16  Ht 5' (1.524 m)  Wt 188 lb (85.276 kg)  BMI 36.72 kg/m2 GEN- NAD, alert and oriented x3 HEENT- PERRL, EOMI, non injected sclera, pink conjunctiva, MMM, oropharynx clear, nares clear, TM Clear bilat no effusion Neck- Supple, no thyromegaly CVS- RRR, no murmur RESP-CTAB Neuro-CNII-XII intact, no deficits EXT- No edema Pulses- Radial - 2+        Assessment & Plan:      Problem List Items Addressed This Visit    None    Visit Diagnoses    Benign paroxysmal positional vertigo, bilateral    -  Primary    Meclizine PRN, advised to monitor her head position get up slowly , if she increased episodes will need  vestibulat training. 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 Any transcriptional errors that result from this process are unintentional.

## 2015-04-06 NOTE — Patient Instructions (Signed)
Take meclizine as needed for vertigo If this worsens Vestibular therapy will be needed F/U as needed

## 2015-07-08 ENCOUNTER — Other Ambulatory Visit: Payer: Self-pay | Admitting: Orthopedic Surgery

## 2015-07-15 ENCOUNTER — Other Ambulatory Visit: Payer: Self-pay | Admitting: Orthopedic Surgery

## 2015-08-19 ENCOUNTER — Encounter (HOSPITAL_BASED_OUTPATIENT_CLINIC_OR_DEPARTMENT_OTHER): Payer: Self-pay | Admitting: *Deleted

## 2015-08-22 ENCOUNTER — Encounter (HOSPITAL_BASED_OUTPATIENT_CLINIC_OR_DEPARTMENT_OTHER): Payer: Self-pay | Admitting: *Deleted

## 2015-08-23 ENCOUNTER — Encounter (HOSPITAL_BASED_OUTPATIENT_CLINIC_OR_DEPARTMENT_OTHER): Payer: Self-pay | Admitting: *Deleted

## 2015-08-23 NOTE — Progress Notes (Signed)
NPO AFTER MN WITH EXCEPTION CLEAR LIQUIDS UNTIL 0900 (NO CREAM/MILK PRODUCTS). ARRIVE AT 1300. NEEDS HG.  MAY TAKE TYLENOL IF NEEDED AM DOS W/ SIPS OF WATER.

## 2015-08-30 ENCOUNTER — Encounter (HOSPITAL_BASED_OUTPATIENT_CLINIC_OR_DEPARTMENT_OTHER)
Admission: RE | Disposition: A | Discharge status: Home / Self Care | Payer: Self-pay | Source: Ambulatory Visit | Attending: Specialist

## 2015-08-30 ENCOUNTER — Ambulatory Visit (HOSPITAL_BASED_OUTPATIENT_CLINIC_OR_DEPARTMENT_OTHER): Payer: Commercial Managed Care - HMO | Admitting: Anesthesiology

## 2015-08-30 ENCOUNTER — Encounter (HOSPITAL_BASED_OUTPATIENT_CLINIC_OR_DEPARTMENT_OTHER): Payer: Self-pay | Admitting: *Deleted

## 2015-08-30 ENCOUNTER — Ambulatory Visit (HOSPITAL_BASED_OUTPATIENT_CLINIC_OR_DEPARTMENT_OTHER)
Admission: RE | Admit: 2015-08-30 | Discharge: 2015-08-30 | Disposition: A | Discharge status: Home / Self Care | Payer: Commercial Managed Care - HMO | Source: Ambulatory Visit | Attending: Specialist | Admitting: Specialist

## 2015-08-30 DIAGNOSIS — M179 Osteoarthritis of knee, unspecified: Secondary | ICD-10-CM | POA: Diagnosis not present

## 2015-08-30 DIAGNOSIS — Z791 Long term (current) use of non-steroidal anti-inflammatories (NSAID): Secondary | ICD-10-CM | POA: Insufficient documentation

## 2015-08-30 DIAGNOSIS — M2241 Chondromalacia patellae, right knee: Secondary | ICD-10-CM | POA: Diagnosis not present

## 2015-08-30 DIAGNOSIS — Z9889 Other specified postprocedural states: Secondary | ICD-10-CM

## 2015-08-30 DIAGNOSIS — M23221 Derangement of posterior horn of medial meniscus due to old tear or injury, right knee: Secondary | ICD-10-CM | POA: Insufficient documentation

## 2015-08-30 DIAGNOSIS — S83271A Complex tear of lateral meniscus, current injury, right knee, initial encounter: Secondary | ICD-10-CM | POA: Diagnosis not present

## 2015-08-30 DIAGNOSIS — M23261 Derangement of other lateral meniscus due to old tear or injury, right knee: Secondary | ICD-10-CM | POA: Insufficient documentation

## 2015-08-30 DIAGNOSIS — S83231A Complex tear of medial meniscus, current injury, right knee, initial encounter: Secondary | ICD-10-CM | POA: Diagnosis not present

## 2015-08-30 HISTORY — DX: Nausea with vomiting, unspecified: R11.2

## 2015-08-30 HISTORY — DX: Other specified postprocedural states: Z98.890

## 2015-08-30 HISTORY — DX: Unspecified tear of unspecified meniscus, current injury, right knee, initial encounter: S83.206A

## 2015-08-30 HISTORY — PX: KNEE ARTHROSCOPY WITH MEDIAL MENISECTOMY: SHX5651

## 2015-08-30 HISTORY — DX: Presence of spectacles and contact lenses: Z97.3

## 2015-08-30 LAB — POCT HEMOGLOBIN-HEMACUE: HEMOGLOBIN: 12.8 g/dL (ref 12.0–15.0)

## 2015-08-30 SURGERY — ARTHROSCOPY, KNEE, WITH MEDIAL MENISCECTOMY
Anesthesia: General | Site: Knee | Laterality: Right

## 2015-08-30 MED ORDER — ASPIRIN EC 325 MG PO TBEC: 325.0000 mg | DELAYED_RELEASE_TABLET | Freq: Two times a day (BID) | ORAL | Status: DC

## 2015-08-30 MED ORDER — LIDOCAINE HCL (CARDIAC) 20 MG/ML IV SOLN
INTRAVENOUS | Status: DC | PRN
  Administered 2015-08-30: 80 mg via INTRAVENOUS

## 2015-08-30 MED ORDER — HYDROMORPHONE HCL 1 MG/ML IJ SOLN
INTRAMUSCULAR | Status: AC
  Filled 2015-08-30: qty 1

## 2015-08-30 MED ORDER — MEPERIDINE HCL 25 MG/ML IJ SOLN
6.2500 mg | INTRAMUSCULAR | Status: DC | PRN
  Filled 2015-08-30: qty 1

## 2015-08-30 MED ORDER — HYDROCODONE-ACETAMINOPHEN 5-325 MG PO TABS: 1.0000 | ORAL_TABLET | Freq: Four times a day (QID) | ORAL | Status: DC | PRN

## 2015-08-30 MED ORDER — MORPHINE SULFATE (PF) 4 MG/ML IV SOLN
INTRAVENOUS | Status: DC | PRN
  Administered 2015-08-30: 4 mg via INTRAMUSCULAR

## 2015-08-30 MED ORDER — FENTANYL CITRATE (PF) 100 MCG/2ML IJ SOLN
INTRAMUSCULAR | Status: DC | PRN
  Administered 2015-08-30 (×2): 25 ug via INTRAVENOUS
  Administered 2015-08-30 (×2): 50 ug via INTRAVENOUS
  Administered 2015-08-30 (×2): 25 ug via INTRAVENOUS

## 2015-08-30 MED ORDER — CEFAZOLIN SODIUM-DEXTROSE 2-3 GM-% IV SOLR
2.0000 g | INTRAVENOUS | Status: AC
  Administered 2015-08-30: 2 g via INTRAVENOUS
  Filled 2015-08-30: qty 50

## 2015-08-30 MED ORDER — LACTATED RINGERS IV SOLN
INTRAVENOUS | Status: DC
  Filled 2015-08-30: qty 1000

## 2015-08-30 MED ORDER — PROMETHAZINE HCL 25 MG/ML IJ SOLN
6.2500 mg | INTRAMUSCULAR | Status: DC | PRN
Start: 2015-08-30 — End: 2015-09-01
  Filled 2015-08-30: qty 1

## 2015-08-30 MED ORDER — ONDANSETRON HCL 4 MG/2ML IJ SOLN
INTRAMUSCULAR | Status: AC
  Filled 2015-08-30: qty 2

## 2015-08-30 MED ORDER — POVIDONE-IODINE 7.5 % EX SOLN
Freq: Once | CUTANEOUS | Status: DC
  Filled 2015-08-30: qty 118

## 2015-08-30 MED ORDER — HYDROMORPHONE HCL 1 MG/ML IJ SOLN
0.2500 mg | INTRAMUSCULAR | Status: DC | PRN
  Administered 2015-08-30: 0.25 mg via INTRAVENOUS
  Filled 2015-08-30: qty 1

## 2015-08-30 MED ORDER — FENTANYL CITRATE (PF) 100 MCG/2ML IJ SOLN
INTRAMUSCULAR | Status: AC
  Filled 2015-08-30: qty 6

## 2015-08-30 MED ORDER — CEFAZOLIN SODIUM-DEXTROSE 2-3 GM-% IV SOLR
INTRAVENOUS | Status: AC
  Filled 2015-08-30: qty 50

## 2015-08-30 MED ORDER — PROMETHAZINE HCL 25 MG/ML IJ SOLN
INTRAMUSCULAR | Status: AC
  Filled 2015-08-30: qty 1

## 2015-08-30 MED ORDER — MIDAZOLAM HCL 2 MG/2ML IJ SOLN
INTRAMUSCULAR | Status: AC
  Filled 2015-08-30: qty 2

## 2015-08-30 MED ORDER — ONDANSETRON HCL 4 MG/2ML IJ SOLN
4.0000 mg | Freq: Once | INTRAMUSCULAR | Status: AC
  Administered 2015-08-30: 4 mg via INTRAVENOUS
  Filled 2015-08-30: qty 2

## 2015-08-30 MED ORDER — SODIUM CHLORIDE 0.9 % IR SOLN
Status: DC | PRN
  Administered 2015-08-30: 9000 mL

## 2015-08-30 MED ORDER — BUPIVACAINE HCL 0.25 % IJ SOLN
INTRAMUSCULAR | Status: DC | PRN
  Administered 2015-08-30: 30 mL

## 2015-08-30 MED ORDER — DEXAMETHASONE SODIUM PHOSPHATE 4 MG/ML IJ SOLN
INTRAMUSCULAR | Status: DC | PRN
  Administered 2015-08-30: 10 mg via INTRAVENOUS

## 2015-08-30 MED ORDER — MORPHINE SULFATE (PF) 4 MG/ML IV SOLN
INTRAVENOUS | Status: AC
  Filled 2015-08-30: qty 1

## 2015-08-30 MED ORDER — ONDANSETRON HCL 4 MG/2ML IJ SOLN
INTRAMUSCULAR | Status: DC | PRN
  Administered 2015-08-30: 4 mg via INTRAVENOUS

## 2015-08-30 MED ORDER — CEPHALEXIN 500 MG PO CAPS: 500.0000 mg | ORAL_CAPSULE | Freq: Three times a day (TID) | ORAL | Status: DC

## 2015-08-30 MED ORDER — ONDANSETRON HCL 4 MG PO TABS: 4.0000 mg | ORAL_TABLET | Freq: Three times a day (TID) | ORAL | Status: DC | PRN

## 2015-08-30 MED ORDER — PROPOFOL 10 MG/ML IV BOLUS
INTRAVENOUS | Status: DC | PRN
  Administered 2015-08-30: 180 mg via INTRAVENOUS

## 2015-08-30 MED ORDER — MIDAZOLAM HCL 5 MG/5ML IJ SOLN
INTRAMUSCULAR | Status: DC | PRN
  Administered 2015-08-30: 2 mg via INTRAVENOUS

## 2015-08-30 MED ORDER — LACTATED RINGERS IV SOLN
INTRAVENOUS | Status: DC
  Administered 2015-08-30 (×2): via INTRAVENOUS
  Filled 2015-08-30: qty 1000

## 2015-08-30 SURGICAL SUPPLY — 59 items
BANDAGE ESMARK 6X9 LF (GAUZE/BANDAGES/DRESSINGS) ×1 IMPLANT
BLADE 4.2CUDA (BLADE) IMPLANT
BLADE CUDA 4.2 (BLADE) IMPLANT
BLADE CUDA GRT WHITE 3.5 (BLADE) ×3 IMPLANT
BLADE CUDA SHAVER 3.5 (BLADE) IMPLANT
BNDG CMPR 9X6 STRL LF SNTH (GAUZE/BANDAGES/DRESSINGS) ×1
BNDG ESMARK 6X9 LF (GAUZE/BANDAGES/DRESSINGS) ×3
BNDG GAUZE ELAST 4 BULKY (GAUZE/BANDAGES/DRESSINGS) ×4 IMPLANT
CANISTER SUCT LVC 12 LTR MEDI- (MISCELLANEOUS) ×5 IMPLANT
CANISTER SUCTION 1200CC (MISCELLANEOUS) ×3 IMPLANT
CLOTH BEACON ORANGE TIMEOUT ST (SAFETY) ×3 IMPLANT
CUFF TOURNIQUET SINGLE 34IN LL (TOURNIQUET CUFF) ×3 IMPLANT
DRAPE ARTHROSCOPY W/POUCH 114 (DRAPES) ×3 IMPLANT
DRAPE INCISE 23X17 IOBAN STRL (DRAPES) ×2
DRAPE INCISE 23X17 STRL (DRAPES) ×1 IMPLANT
DRAPE INCISE IOBAN 23X17 STRL (DRAPES) ×1 IMPLANT
DRAPE INCISE IOBAN 66X45 STRL (DRAPES) ×3 IMPLANT
DRSG PAD ABDOMINAL 8X10 ST (GAUZE/BANDAGES/DRESSINGS) ×2 IMPLANT
DURAPREP 26ML APPLICATOR (WOUND CARE) ×3 IMPLANT
ELECT MENISCUS 165MM 90D (ELECTRODE) IMPLANT
ELECT REM PT RETURN 9FT ADLT (ELECTROSURGICAL)
ELECTRODE REM PT RTRN 9FT ADLT (ELECTROSURGICAL) IMPLANT
GAUZE XEROFORM 1X8 LF (GAUZE/BANDAGES/DRESSINGS) ×3 IMPLANT
GLOVE BIO SURGEON STRL SZ7.5 (GLOVE) ×3 IMPLANT
GLOVE BIO SURGEON STRL SZ8 (GLOVE) ×6 IMPLANT
GLOVE BIOGEL PI IND STRL 7.5 (GLOVE) IMPLANT
GLOVE BIOGEL PI INDICATOR 7.5 (GLOVE) ×6
GLOVE INDICATOR 8.0 STRL GRN (GLOVE) ×6 IMPLANT
GOWN STRL REUS W/ TWL LRG LVL3 (GOWN DISPOSABLE) ×1 IMPLANT
GOWN STRL REUS W/ TWL XL LVL3 (GOWN DISPOSABLE) ×1 IMPLANT
GOWN STRL REUS W/TWL LRG LVL3 (GOWN DISPOSABLE) ×5 IMPLANT
GOWN STRL REUS W/TWL XL LVL3 (GOWN DISPOSABLE) ×7 IMPLANT
IMMOBILIZER KNEE 22 UNIV (SOFTGOODS) IMPLANT
IMMOBILIZER KNEE 24 THIGH 36 (MISCELLANEOUS) IMPLANT
IMMOBILIZER KNEE 24 UNIV (MISCELLANEOUS)
IV NS IRRIG 3000ML ARTHROMATIC (IV SOLUTION) ×6 IMPLANT
KNEE WRAP E Z 3 GEL PACK (MISCELLANEOUS) ×3 IMPLANT
MANIFOLD NEPTUNE II (INSTRUMENTS) ×2 IMPLANT
MINI VAC (SURGICAL WAND) ×3 IMPLANT
NEEDLE HYPO 22GX1.5 SAFETY (NEEDLE) ×3 IMPLANT
PACK ARTHROSCOPY DSU (CUSTOM PROCEDURE TRAY) ×3 IMPLANT
PACK BASIN DAY SURGERY FS (CUSTOM PROCEDURE TRAY) ×3 IMPLANT
PAD ABD 8X10 STRL (GAUZE/BANDAGES/DRESSINGS) ×6 IMPLANT
PAD ARMBOARD 7.5X6 YLW CONV (MISCELLANEOUS) IMPLANT
PADDING CAST ABS 4INX4YD NS (CAST SUPPLIES) ×2
PADDING CAST ABS COTTON 4X4 ST (CAST SUPPLIES) ×1 IMPLANT
PENCIL BUTTON HOLSTER BLD 10FT (ELECTRODE) IMPLANT
SET ARTHROSCOPY TUBING (MISCELLANEOUS) ×3
SET ARTHROSCOPY TUBING LN (MISCELLANEOUS) ×1 IMPLANT
SPONGE GAUZE 4X4 12PLY (GAUZE/BANDAGES/DRESSINGS) ×3 IMPLANT
SPONGE GAUZE 4X4 12PLY STER LF (GAUZE/BANDAGES/DRESSINGS) ×2 IMPLANT
SUT ETHILON 4 0 PS 2 18 (SUTURE) ×3 IMPLANT
SYR CONTROL 10ML LL (SYRINGE) ×3 IMPLANT
TOWEL OR 17X24 6PK STRL BLUE (TOWEL DISPOSABLE) ×5 IMPLANT
TUBE CONNECTING 12'X1/4 (SUCTIONS) ×1
TUBE CONNECTING 12X1/4 (SUCTIONS) ×2 IMPLANT
WAND 30 DEG SABER W/CORD (SURGICAL WAND) IMPLANT
WAND 90 DEG TURBOVAC W/CORD (SURGICAL WAND) IMPLANT
WATER STERILE IRR 500ML POUR (IV SOLUTION) ×3 IMPLANT

## 2015-08-30 NOTE — Anesthesia Postprocedure Evaluation (Signed)
  Anesthesia Post-op Note  Patient: Charlotte Rice  Procedure(s) Performed: Procedure(s): RIGHT KNEE ARTHROSCOPY / PARTIAL MEDIAL MENISCECTOMY  AND CHONDROPLASTY  (Right)  Patient Location: PACU  Anesthesia Type:General  Level of Consciousness: awake, alert  and oriented  Airway and Oxygen Therapy: Patient Spontanous Breathing  Post-op Pain: mild  Post-op Assessment: Post-op Vital signs reviewed and Patient's Cardiovascular Status Stable     RLE Motor Response: Responds to commands RLE Sensation: Full sensation      Post-op Vital Signs: Reviewed and stable  Last Vitals:  Filed Vitals:   08/30/15 1730  BP: 128/77  Pulse: 65  Temp:   Resp: 17    Complications: No apparent anesthesia complications

## 2015-08-30 NOTE — Op Note (Signed)
Dictated#[id number]

## 2015-08-30 NOTE — Discharge Instructions (Signed)

## 2015-08-30 NOTE — H&P (View-Only) (Signed)
NPO AFTER MN WITH EXCEPTION CLEAR LIQUIDS UNTIL 0900 (NO CREAM/MILK PRODUCTS). ARRIVE AT 1300. NEEDS HG.  MAY TAKE TYLENOL IF NEEDED AM DOS W/ SIPS OF WATER.

## 2015-08-30 NOTE — Transfer of Care (Signed)
Immediate Anesthesia Transfer of Care Note  Patient: Charlotte Rice  Procedure(s) Performed: Procedure(s) (LRB): RIGHT KNEE ARTHROSCOPY / PARTIAL MEDIAL MENISCECTOMY  AND CHONDROPLASTY  (Right)  Patient Location: PACU  Anesthesia Type: General  Level of Consciousness: awake, oriented, sedated and patient cooperative  Airway & Oxygen Therapy: Patient Spontanous Breathing and Patient connected to face mask oxygen  Post-op Assessment: Report given to PACU RN and Post -op Vital signs reviewed and stable  Post vital signs: Reviewed and stable  Complications: No apparent anesthesia complications

## 2015-08-30 NOTE — Anesthesia Procedure Notes (Signed)
Procedure Name: LMA Insertion Date/Time: 08/30/2015 4:23 PM Performed by: Denna Haggard D Pre-anesthesia Checklist: Patient identified, Emergency Drugs available, Suction available and Patient being monitored Patient Re-evaluated:Patient Re-evaluated prior to inductionOxygen Delivery Method: Circle System Utilized Preoxygenation: Pre-oxygenation with 100% oxygen Intubation Type: IV induction Ventilation: Mask ventilation without difficulty LMA: LMA inserted LMA Size: 4.0 Number of attempts: 1 Airway Equipment and Method: Bite block Placement Confirmation: positive ETCO2 Tube secured with: Tape Dental Injury: Teeth and Oropharynx as per pre-operative assessment

## 2015-08-30 NOTE — H&P (Signed)
Charlotte Rice is an 54 y.o. female.   Chief Complaint: Right knee pain HPI: Patient presents with joint discomfort that had been persistent for several weeks now. Despite conservative treatments, her discomfort has not improved. Imaging was obtained. Other conservative and surgical treatments were discussed in detail. Patient wishes to proceed with surgery as consented. Denies SOB, CP, or calf pain. No Fever, chills, or nausea/ vomiting.   Past Medical History  Diagnosis Date  . Migraines   . Arthritis     knees  . Right knee meniscal tear   . Wears glasses     Past Surgical History  Procedure Laterality Date  . Cholecystectomy  1994  . Tubal ligation  07-30-2003  . Shoulder open rotator cuff repair Right 2008  . Knee arthroscopy Right 1995    Family History  Problem Relation Age of Onset  . Cancer Mother     breast   . Diabetes Father   . Hyperlipidemia Father   . Hypertension Father   . Heart disease Sister    Social History:  reports that she has never smoked. She has never used smokeless tobacco. She reports that she does not drink alcohol or use illicit drugs.  Allergies: No Known Allergies  Medications Prior to Admission  Medication Sig Dispense Refill  . aspirin-acetaminophen-caffeine (EXCEDRIN MIGRAINE) 250-250-65 MG per tablet Take 2 tablets by mouth every 6 (six) hours as needed for headache.    . cyclobenzaprine (FLEXERIL) 10 MG tablet TAKE ONE TABLET BY MOUTH THREE TIMES DAILY AS NEEDED FOR MUSCLE SPASM 30 tablet 0  . Naproxen Sodium (ALEVE) 220 MG CAPS Take by mouth 2 (two) times daily.    Marland Kitchen eletriptan (RELPAX) 20 MG tablet Take 20 mg by mouth as needed for migraine or headache. May repeat in 2 hours if headache persists or recurs.      Results for orders placed or performed during the hospital encounter of 08/30/15 (from the past 48 hour(s))  Hemoglobin-hemacue, POC     Status: None   Collection Time: 08/30/15  2:08 PM  Result Value Ref Range   Hemoglobin 12.8 12.0 - 15.0 g/dL   No results found.  Review of Systems  Constitutional: Negative.   HENT: Negative.   Eyes: Negative.   Respiratory: Negative.   Cardiovascular: Negative.   Gastrointestinal: Negative.   Genitourinary: Negative.   Musculoskeletal: Positive for joint pain.  Skin: Negative.   Neurological: Negative.   Endo/Heme/Allergies: Negative.   Psychiatric/Behavioral: Negative.     Blood pressure 132/79, pulse 65, temperature 98.5 F (36.9 C), temperature source Oral, resp. rate 16, height 5' (1.524 m), weight 83.915 kg (185 lb), SpO2 100 %. Physical Exam  Constitutional: She is oriented to person, place, and time. She appears well-developed.  HENT:  Head: Normocephalic.  Eyes: EOM are normal.  Neck: Normal range of motion.  Cardiovascular: Normal rate, normal heart sounds and intact distal pulses.   Respiratory: Effort normal and breath sounds normal.  GI: Bowel sounds are normal.  Genitourinary:  Deferred  Musculoskeletal:  Right knee joint line pain with palpation. Good ROM of right knee. Right Calf soft and non tender. RLE grossly n/v intact.   Neurological: She is alert and oriented to person, place, and time.  Skin: Skin is warm and dry.  Psychiatric: Her behavior is normal.     Assessment/Plan Right knee chondromalacia, torn medial and lateral meniscus: Right knee scope as consented at [facility] D/c home today with family Follow instructions F/u in office in  7 days or PRN Take medicine as directed   Jeanenne Licea L 08/30/2015, 4:04 PM

## 2015-08-30 NOTE — Anesthesia Preprocedure Evaluation (Addendum)
Anesthesia Evaluation  Patient identified by MRN, date of birth, ID band Patient awake    Reviewed: Allergy & Precautions, NPO status , Patient's Chart, lab work & pertinent test results  Airway Mallampati: II  TM Distance: >3 FB Neck ROM: Full    Dental no notable dental hx. (+) Teeth Intact   Pulmonary neg pulmonary ROS,    Pulmonary exam normal breath sounds clear to auscultation       Cardiovascular negative cardio ROS Normal cardiovascular exam Rhythm:Regular Rate:Normal     Neuro/Psych negative neurological ROS  negative psych ROS   GI/Hepatic negative GI ROS, Neg liver ROS,   Endo/Other  negative endocrine ROS  Renal/GU negative Renal ROS  negative genitourinary   Musculoskeletal negative musculoskeletal ROS (+)   Abdominal   Peds negative pediatric ROS (+)  Hematology negative hematology ROS (+)   Anesthesia Other Findings   Reproductive/Obstetrics negative OB ROS                            Lab Results  Component Value Date   WBC 8.9 05/05/2014   HGB 12.8 08/30/2015   HCT 35.1* 05/05/2014   MCV 84.8 05/05/2014   PLT 307 05/05/2014   Lab Results  Component Value Date   CREATININE 1.27* 05/05/2014   BUN 19 05/05/2014   NA 138 05/05/2014   K 4.0 05/05/2014   CL 101 05/05/2014   CO2 26 05/05/2014   No results found for: INR, PROTIME   Anesthesia Physical Anesthesia Plan  ASA: II  Anesthesia Plan: General   Post-op Pain Management:    Induction: Intravenous  Airway Management Planned: LMA  Additional Equipment:   Intra-op Plan:   Post-operative Plan: Extubation in OR  Informed Consent: I have reviewed the patients History and Physical, chart, labs and discussed the procedure including the risks, benefits and alternatives for the proposed anesthesia with the patient or authorized representative who has indicated his/her understanding and acceptance.    Dental advisory given  Plan Discussed with: CRNA  Anesthesia Plan Comments:         Anesthesia Quick Evaluation

## 2015-08-30 NOTE — Interval H&P Note (Signed)
History and Physical Interval Note:  08/30/2015 4:13 PM  Charlotte Rice  has presented today for surgery, with the diagnosis of RIGHT KNEE MEDIAL AND LATERAL MENSICIAL TEAR AND CHONDROMALACIA   The various methods of treatment have been discussed with the patient and family. After consideration of risks, benefits and other options for treatment, the patient has consented to  Procedure(s): RIGHT KNEE ARTHROSCOPY DEBRIDEMENT AND PARTIAL MENISCECTOMY  AND CHONDROPLASTY  (Right) as a surgical intervention .  The patient's history has been reviewed, patient examined, no change in status, stable for surgery.  I have reviewed the patient's chart and labs.  Questions were answered to the patient's satisfaction.     Rease Swinson ANDREW

## 2015-08-31 ENCOUNTER — Encounter (HOSPITAL_BASED_OUTPATIENT_CLINIC_OR_DEPARTMENT_OTHER): Payer: Self-pay | Admitting: Specialist

## 2015-09-01 NOTE — Op Note (Signed)
NAMEKAYDENSE, RIZO              ACCOUNT NO.:  [id number]  MEDICAL RECORD NO.:  [id number]  LOCATION:                               FACILITY:  [facility]  PHYSICIAN:  Cynda Familia, M.D.DATE OF BIRTH:  [birth year]  DATE OF PROCEDURE:  08/30/2015 DATE OF DISCHARGE:  08/30/2015                              OPERATIVE REPORT   PREOPERATIVE DIAGNOSES:  Right knee torn medial meniscus, torn lateral meniscus, osteoarthritis.  POSTOPERATIVE DIAGNOSES: 1. Right knee complex tear, posteromedial meniscus. 2. Large complex tear anterior 2/3rd lateral meniscus. 3. Osteoarthritis grade 3, chondromalacia patellae, grade 3+ femoral     trochlea, grade 3+ medial femoral condyle, grade 3+ lateral femoral     condyle, and frayed degenerative ACL ligament.  PROCEDURES: 1. Right knee arthroscopic partial medial meniscectomy. 2. Partial __________ meniscectomy. 3. Tricompartmental chondroplasty and __________ partial ACL fibers.  SURGEON:  Cynda Familia, M.D.  ASSISTANT:  Wyatt Portela, PA-C.  ANESTHESIA:  General with intraoperative knee block.  ESTIMATED BLOOD LOSS:  Minimal.  DRAINS:  None.  COMPLICATION:  None.  TOURNIQUET TIME:  40 minutes at 250 mmHg.  DISPOSITION:  PACU in stable.  OPERATIVE DETAILS:  The patient and family were counseled in holding area.  Correct site was marked, signed appropriately.  IV antibiotics given, taken to the operating room, placed in supine position, given general anesthesia.  All extremities were well padded and bumped.  IV Ancef was given.  Prepped with DuraPrep and draped in sterile fashion. Placed a right thigh holder.  A time-out was done confirming the right side.  Arthroscopic portals established; proximal, medial, inferomedial, inferolateral, tried to use __________ arthroscopic portals as much as possible.  Diagnostic arthroscopy revealed grade 3+ chondromalacia, patellofemoral tracking and mechanical compress __________  medial lateral gutters were unremarkable.  ACL showed fraying, but no frank tear.  Some of the ACL __________ was debrided __________ femoral notch. The PCL was intact and protected.  Medial side was inspected.  Radial tear posterior horn __________ baskets and motorized shaver mini-vac system, partial meniscectomy was performed back to stable base.  There was also grade 3+ chondromalacia of medial femoral condyle __________ back to stable base.  Lateral side inspected, she had a macerated degenerative-type tear of the lateral meniscus involving the anterior 2/3rds.  Utilizing basket and motorized shaver and __________.  Subtotal partial meniscectomy was performed back to a stable base.  Anterior mechanical shaver did a chondroplasty of the lateral femoral condyle for grade 2+ changes also __________ plateau.  The knee was sequentially inspected.  There were no other abnormalities noted.  Irrigated, arthroscopic ports were removed.  Portals were closed with nylon sutures.  10 mL of Sensorcaine placed in skin __________ morphine sulfate was injected in knee joint.  Satisfied __________ no complications.  She was awakened, taken from the operating room to PACU in stable condition.  __________stable in PACU, discharged home.          ______________________________ Cynda Familia, M.D.     RAC/MEDQ  D:  08/30/2015  T:  08/31/2015  Job:  [id number]

## 2015-10-25 ENCOUNTER — Other Ambulatory Visit: Payer: 59 | Admitting: Adult Health

## 2015-10-28 ENCOUNTER — Encounter: Payer: Self-pay | Admitting: Adult Health

## 2015-10-28 ENCOUNTER — Ambulatory Visit (INDEPENDENT_AMBULATORY_CARE_PROVIDER_SITE_OTHER): Payer: Commercial Managed Care - HMO | Admitting: Adult Health

## 2015-10-28 VITALS — BP 130/80 | HR 64 | Ht 60.0 in | Wt 191.4 lb

## 2015-10-28 DIAGNOSIS — Z1212 Encounter for screening for malignant neoplasm of rectum: Secondary | ICD-10-CM | POA: Diagnosis not present

## 2015-10-28 DIAGNOSIS — Z01419 Encounter for gynecological examination (general) (routine) without abnormal findings: Secondary | ICD-10-CM | POA: Diagnosis not present

## 2015-10-28 DIAGNOSIS — B369 Superficial mycosis, unspecified: Secondary | ICD-10-CM

## 2015-10-28 HISTORY — DX: Superficial mycosis, unspecified: B36.9

## 2015-10-28 LAB — HEMOCCULT GUIAC POC 1CARD (OFFICE): FECAL OCCULT BLD: NEGATIVE

## 2015-10-28 MED ORDER — NYSTATIN 100000 UNIT/GM EX POWD: CUTANEOUS | Status: DC

## 2015-10-28 NOTE — Progress Notes (Signed)
Patient ID: Charlotte Rice, female   DOB: 04-Aug-1961, 54 y.o.   MRN: [id number] History of Present Illness: Charlotte Rice is a 54 year old black female, G2P2 in for a well woman gyn exam, she had a normal pap with negative HPV 07/01/13.She got flu shot in October at work and had lap right knee about 9 weeks ago.  Current Medications, Allergies, Past Medical History, Past Surgical History, Family History and Social History were reviewed in [employer] record.     Review of Systems: Patient denies any daily headaches, hearing loss, fatigue, blurred vision, shortness of breath, chest pain, abdominal pain, problems with bowel movements, urination, or intercourse. No mood swings, right knee still stiff. See HPI for positives  Physical Exam:BP 130/80 mmHg  Pulse 64  Ht 5' (1.524 m)  Wt 191 lb 6.4 oz (86.818 kg)  BMI 37.38 kg/m2  General:  Well developed, well nourished, no acute distress Skin:  Warm and dry Neck:  Midline trachea,  Thyroid feels enlarged,good ROM, no lymphadenopathy Lungs; Clear to auscultation bilaterally Breast:  No dominant palpable mass, retraction, or nipple discharge Cardiovascular: Regular rate and rhythm Abdomen:  Soft, non tender, no hepatosplenomegaly Pelvic:  External genitalia is normal in appearance, but has skin fungus in groin.  The vagina is normal in appearance. Urethra has no lesions or masses. The cervix is smooth.  Uterus is felt to be normal size, shape, and contour.  No adnexal masses or tenderness noted.Bladder is non tender, no masses felt. Rectal: Good sphincter tone, no polyps, or hemorrhoids felt.  Hemoccult negative. Extremities/musculoskeletal:  No swelling or varicosities noted, no clubbing or cyanosis,mild swelling right knee Psych:  No mood changes, alert and cooperative,seems happy   Impression: Well woman gyn exam no pap Skin fungus    Plan: Get mammogram now,past due then yearly Labs with PCP Pap and physical in 1  year Colonoscopy per GI Rx Nystatin powders to use 2-3 x daily prn in groin with 2 refills

## 2015-10-28 NOTE — Patient Instructions (Signed)
Get mammogram now and yearly Pap and physical in 1 year Colonoscopy per GI Labs with PCP

## 2015-11-27 HISTORY — PX: CARPAL TUNNEL RELEASE: SHX101

## 2015-12-21 ENCOUNTER — Encounter: Payer: Self-pay | Admitting: Advanced Practice Midwife

## 2015-12-21 ENCOUNTER — Ambulatory Visit (INDEPENDENT_AMBULATORY_CARE_PROVIDER_SITE_OTHER): Payer: Commercial Managed Care - HMO | Admitting: Advanced Practice Midwife

## 2015-12-21 VITALS — BP 120/74 | Ht 60.0 in | Wt 192.0 lb

## 2015-12-21 DIAGNOSIS — B369 Superficial mycosis, unspecified: Secondary | ICD-10-CM

## 2015-12-21 DIAGNOSIS — N898 Other specified noninflammatory disorders of vagina: Secondary | ICD-10-CM

## 2015-12-21 MED ORDER — FLUCONAZOLE 150 MG PO TABS: ORAL_TABLET | ORAL | Status: DC

## 2015-12-21 NOTE — Progress Notes (Signed)
   [facility] Visit  Patient name: Charlotte Rice MRN [id number]  Date of birth: 1961/03/21  CC & HPI:  Charlotte Rice is a 55 y.o. African American female presenting today for itchy rash on groin/  She had used nystatin powder on it for a little while, but rash is bigger now.  Feels like powder didn't stay on vey well.   Pertinent History Reviewed:  Medical & Surgical Hx:   Past Medical History  Diagnosis Date  . Migraines   . Arthritis     knees  . Right knee meniscal tear   . Wears glasses   . PONV (postoperative nausea and vomiting)     vertigo [id number]   . Superficial fungus infection of skin 10/28/2015   Past Surgical History  Procedure Laterality Date  . Cholecystectomy  1994  . Tubal ligation  07-30-2003  . Shoulder open rotator cuff repair Right 2008  . Knee arthroscopy Right 1995  . Knee arthroscopy with medial menisectomy Right 08/30/2015    Procedure: RIGHT KNEE ARTHROSCOPY / PARTIAL MEDIAL MENISCECTOMY  AND CHONDROPLASTY ;  Surgeon: Sydnee Cabal, MD;  Location: [facility];  Service: Orthopedics;  Laterality: Right;   Family History  Problem Relation Age of Onset  . Cancer Mother     breast   . Diabetes Father   . Hyperlipidemia Father   . Hypertension Father   . Heart disease Sister     Current outpatient prescriptions:  .  nystatin (MYCOSTATIN/NYSTOP) 100000 UNIT/GM POWD, Use 2-3 x daily prn in groin area, Disp: 30 g, Rfl: 3 .  fluconazole (DIFLUCAN) 150 MG tablet, 1 po stat; repeat in 3 days, Disp: 2 tablet, Rfl: 2 Social History: Reviewed -  reports that she has never smoked. She has never used smokeless tobacco.  Review of Systems:   Constitutional: Negative for fever and chills Eyes: Negative for visual disturbances Respiratory: Negative for shortness of breath, dyspnea Cardiovascular: Negative for chest pain or palpitations  Gastrointestinal: Negative for vomiting, diarrhea and constipation; no abdominal  pain Genitourinary: Negative for dysuria and urgency, vaginal irritation or itching Musculoskeletal: Negative for back pain, joint pain, myalgias  Neurological: Negative for dizziness and headaches    Objective Findings:    Physical Examination: General appearance - well appearing, and in no distress Mental status - alert, oriented to person, place, and time Chest:  Normal respiratory effort Heart - normal rate and regular rhythm Abdomen:  Soft, nontender Pelvic: yeast appearing rash on thigh/groin.  SSE:  White muusy DC, no odor. Wet prep some yeast, no clue, many WBC, no trich Musculoskeletal:  Normal range of motion without pain Extremities:  No edema    No results found for this or any previous visit (from the past 24 hour(s)).    Assessment & Plan:  A:   Skin fungus P:  Diflucan/OTC miconozole (not on forumulary)  GC/CHL d/t WBC   Return if symptoms worsen or fail to improve.  CRESENZO-DISHMAN,Glover Capano CNM 12/21/2015 3:58 PM

## 2015-12-21 NOTE — Patient Instructions (Signed)
Miconazole (at [facility]) or similar (anything that says it's for yeast infection) and put on rash twice a day.

## 2015-12-23 ENCOUNTER — Encounter: Payer: Self-pay | Admitting: Family Medicine

## 2015-12-23 ENCOUNTER — Ambulatory Visit (INDEPENDENT_AMBULATORY_CARE_PROVIDER_SITE_OTHER): Payer: Commercial Managed Care - HMO | Admitting: Family Medicine

## 2015-12-23 VITALS — BP 110/70 | HR 76 | Temp 98.9°F | Resp 14 | Ht 60.0 in | Wt 195.0 lb

## 2015-12-23 DIAGNOSIS — R42 Dizziness and giddiness: Secondary | ICD-10-CM

## 2015-12-23 DIAGNOSIS — H539 Unspecified visual disturbance: Secondary | ICD-10-CM

## 2015-12-23 LAB — CBC WITH DIFFERENTIAL/PLATELET
BASOS PCT: 0 % (ref 0–1)
Basophils Absolute: 0 10*3/uL (ref 0.0–0.1)
EOS ABS: 0.2 10*3/uL (ref 0.0–0.7)
Eosinophils Relative: 4 % (ref 0–5)
HCT: 37.6 % (ref 36.0–46.0)
Hemoglobin: 12.3 g/dL (ref 12.0–15.0)
Lymphocytes Relative: 27 % (ref 12–46)
Lymphs Abs: 1.7 10*3/uL (ref 0.7–4.0)
MCH: 29.1 pg (ref 26.0–34.0)
MCHC: 32.7 g/dL (ref 30.0–36.0)
MCV: 89.1 fL (ref 78.0–100.0)
MONO ABS: 0.4 10*3/uL (ref 0.1–1.0)
MONOS PCT: 6 % (ref 3–12)
MPV: 9.7 fL (ref 8.6–12.4)
NEUTROS ABS: 3.9 10*3/uL (ref 1.7–7.7)
NEUTROS PCT: 63 % (ref 43–77)
PLATELETS: 296 10*3/uL (ref 150–400)
RBC: 4.22 MIL/uL (ref 3.87–5.11)
RDW: 15.2 % (ref 11.5–15.5)
WBC: 6.2 10*3/uL (ref 4.0–10.5)

## 2015-12-23 LAB — COMPREHENSIVE METABOLIC PANEL
ALT: 9 U/L (ref 6–29)
AST: 13 U/L (ref 10–35)
Albumin: 3.6 g/dL (ref 3.6–5.1)
Alkaline Phosphatase: 122 U/L (ref 33–130)
BILIRUBIN TOTAL: 0.3 mg/dL (ref 0.2–1.2)
BUN: 10 mg/dL (ref 7–25)
CHLORIDE: 104 mmol/L (ref 98–110)
CO2: 27 mmol/L (ref 20–31)
CREATININE: 0.81 mg/dL (ref 0.50–1.05)
Calcium: 9.1 mg/dL (ref 8.6–10.4)
GLUCOSE: 94 mg/dL (ref 70–99)
Potassium: 3.9 mmol/L (ref 3.5–5.3)
SODIUM: 140 mmol/L (ref 135–146)
Total Protein: 7 g/dL (ref 6.1–8.1)

## 2015-12-23 LAB — TSH: TSH: 2.453 u[IU]/mL (ref 0.350–4.500)

## 2015-12-23 MED ORDER — MECLIZINE HCL 25 MG PO TABS: 25.0000 mg | ORAL_TABLET | Freq: Three times a day (TID) | ORAL | Status: DC | PRN

## 2015-12-23 NOTE — Progress Notes (Addendum)
Patient ID: Charlotte Rice, female   DOB: 08/27/1961, 55 y.o.   MRN: [id number]   Subjective:    Patient ID: Charlotte Rice, female    DOB: 17-Jul-1961, 55 y.o.   MRN: [id number]  Patient presents for Vertigo  patient here with recurrent dizzy spells. She was last seen in May diagnosed with vertigo. She been having dizzy episodes on and off associated nausea and vomiting. 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 I gave her meclizine and she uses a couple times. Since then however she continues to have spells maybe a couple times a month she has severe spell in the middle of physical therapy for her knee. This was back in October she states that she felt "funny" that morning however when he laid her back to do her physical therapy she began having severe dizziness and started vomiting profusely. She's had a couple more spell since then. She also noted some changes in her vision. She went to her eye doctor today could not find a cause of her vision changes. She denies any headaches associated no change in speech and her episodes. She is only on meloxicam as needed and she is currently out of work because of her recent knee surgery.    Review Of Systems:  GEN- denies fatigue, fever, weight loss,weakness, recent illness HEENT- denies eye drainage, +change in vision, nasal discharge, CVS- denies chest pain, palpitations RESP- denies SOB, cough, wheeze ABD- denies N/V, change in stools, abd pain GU- denies dysuria, hematuria, dribbling, incontinence MSK- denies joint pain, muscle aches, injury Neuro- denies headache, +dizziness, syncope, seizure activity       Objective:    BP 110/70 mmHg  Pulse 76  Temp(Src) 98.9 F (37.2 C) (Oral)  Resp 14  Ht 5' (1.524 m)  Wt 195 lb (88.451 kg)  BMI 38.08 kg/m2 GEN- NAD, alert and oriented x3 HEENT- PERRL, EOMI, non injected sclera, pink conjunctiva, MMM, oropharynx clear Neck- Supple, no thyromegaly, no carotid bruit  CVS- RRR, no  murmur RESP-CTAB Neuro-CNII-XII intact, no focal deficits  EXT- No edema Pulses- Radial - 2+        Assessment & Plan:      Problem List Items Addressed This Visit    None    Visit Diagnoses    Dizziness    -  Primary    Concerning recurrent episodes and now visual changes, check labs and TSH, MRI brain rule out CNS pathology, IF both neg, send to PT for vestibular rehab Obtain note from eye doctor Meclizine prn for now    Relevant Orders    CBC with Differential/Platelet    Comprehensive metabolic panel    TSH    MR Brain Wo Contrast    Vision changes        Relevant Orders    MR Brain Wo Contrast       Note: This dictation was prepared with Dragon dictation along with smaller phrase technology. Any transcriptional errors that result from this process are unintentional.

## 2015-12-23 NOTE — Patient Instructions (Addendum)
[facility]- last note  Continue current meds Okay to take meclizine a couple days if you have a spell MRI of brain to be done F/U pending results

## 2015-12-24 LAB — GC/CHLAMYDIA PROBE AMP
Chlamydia trachomatis, NAA: NEGATIVE
Neisseria gonorrhoeae by PCR: NEGATIVE

## 2015-12-29 ENCOUNTER — Telehealth: Payer: Self-pay | Admitting: Advanced Practice Midwife

## 2015-12-29 NOTE — Telephone Encounter (Signed)
Pt informed GC/CHL from 12/21/2015 negative.

## 2015-12-30 ENCOUNTER — Ambulatory Visit (HOSPITAL_COMMUNITY)
Admission: RE | Admit: 2015-12-30 | Discharge: 2015-12-30 | Disposition: A | Discharge status: Home / Self Care | Payer: Commercial Managed Care - HMO | Source: Ambulatory Visit | Attending: Family Medicine | Admitting: Family Medicine

## 2015-12-30 DIAGNOSIS — R42 Dizziness and giddiness: Secondary | ICD-10-CM | POA: Insufficient documentation

## 2015-12-30 DIAGNOSIS — H539 Unspecified visual disturbance: Secondary | ICD-10-CM

## 2016-01-02 ENCOUNTER — Other Ambulatory Visit: Payer: Self-pay | Admitting: *Deleted

## 2016-01-02 DIAGNOSIS — H8110 Benign paroxysmal vertigo, unspecified ear: Secondary | ICD-10-CM

## 2016-06-02 IMAGING — MR MR HEAD W/O CM
6 of 10 series · 21 of 48 positions shown · non-contrast
Comparison: Paranasal sinus CT 03/04/2012. Report of 03/26/2002
brain MRI (no images available).

CLINICAL DATA: 54-year-old female with vertigo for several years.
Recurrent episodes of dizziness now with visual changes. No known
head injury. Personal history of migraines. Initial encounter.

EXAM:
MRI HEAD WITHOUT CONTRAST
TECHNIQUE: Multiplanar, multiecho pulse sequences of the brain and surrounding
structures were obtained without intravenous contrast.

[Series 5: T1 · sagittal · 5.0mm · 0.43mm/px · 2 of 21 slices shown (1 of 2)]
[im 1/21]
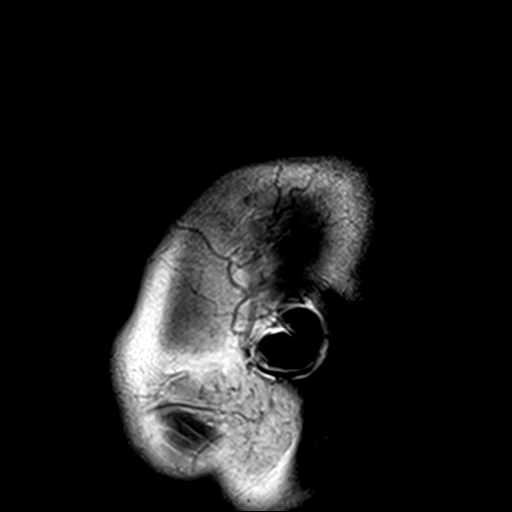
[im 21/21]
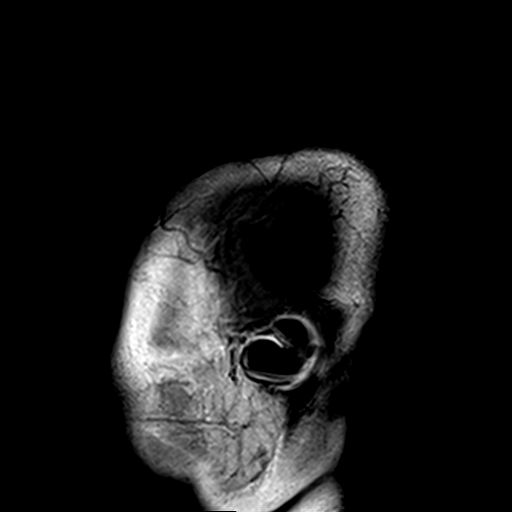

[Series 6: T2 · axial · 5.0mm · 0.48mm/px · z∈[-52,+89]mm · 3 of 23 slices shown (1 of 2)]
[im 1/23]
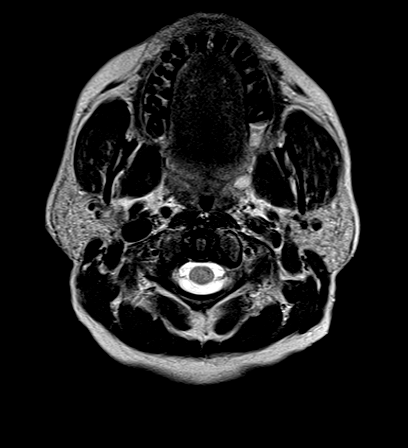
[im 12/23]
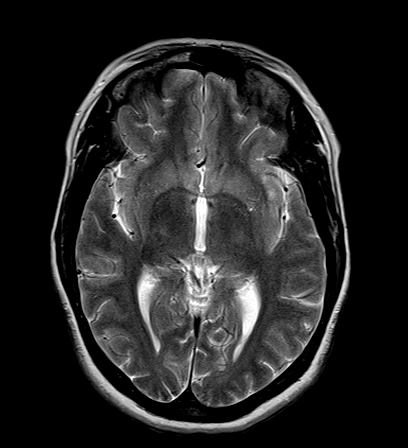
[im 23/23]
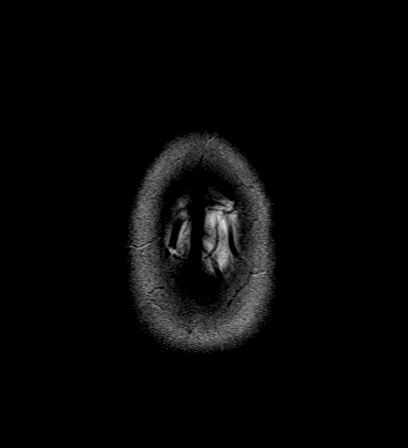

[Series 7: FLAIR · axial · 5.0mm · 0.33mm/px · z∈[-51,+90]mm · 3 of 23 slices shown]
[im 1/23]
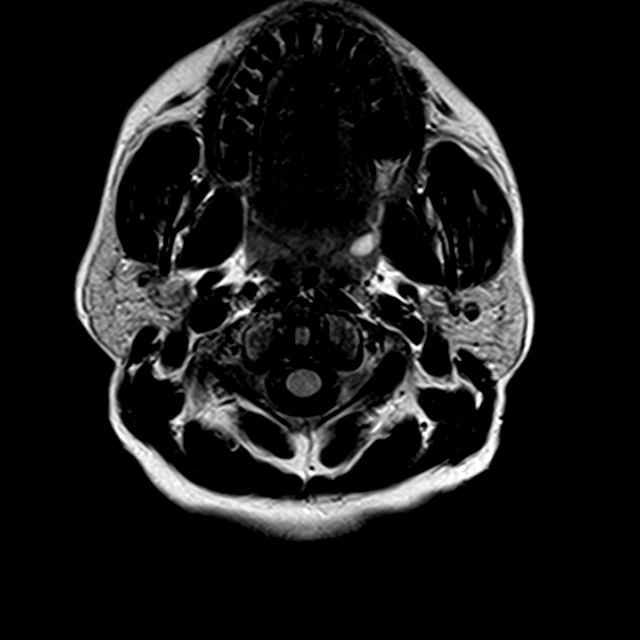
[im 12/23]
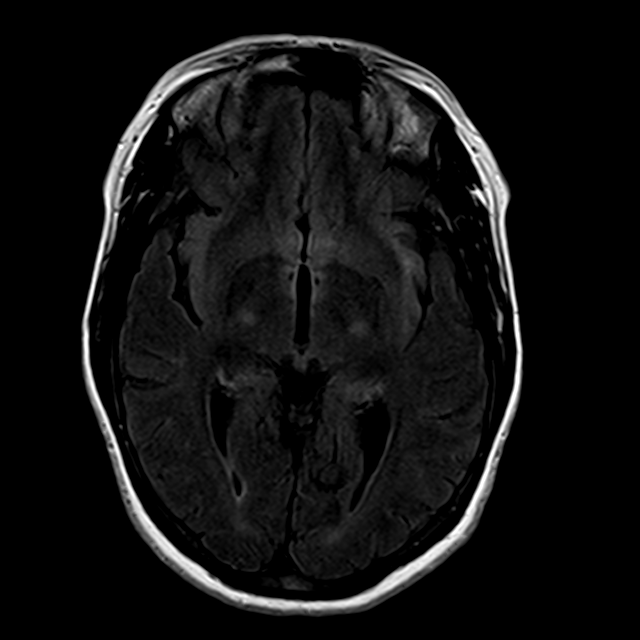
[im 23/23]
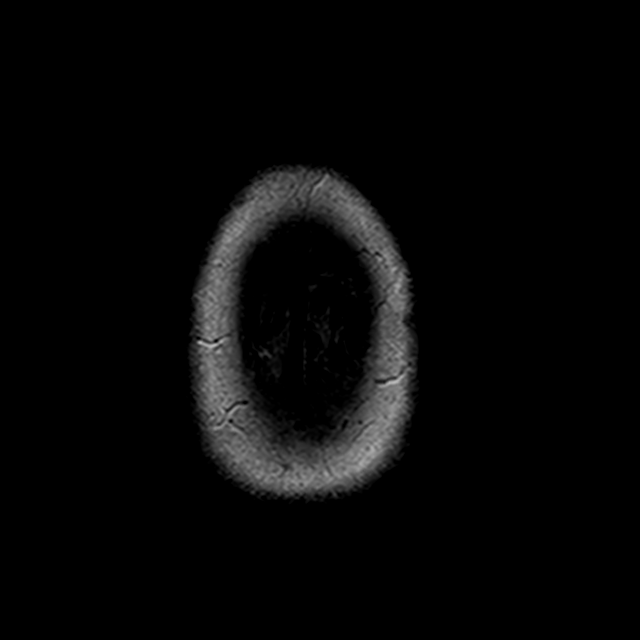

[Series 8: T1 · axial · 2.0mm · 0.40mm/px · z∈[-43,+94]mm · 8 of 70 slices shown (2 of 2)]
[im 1/70]
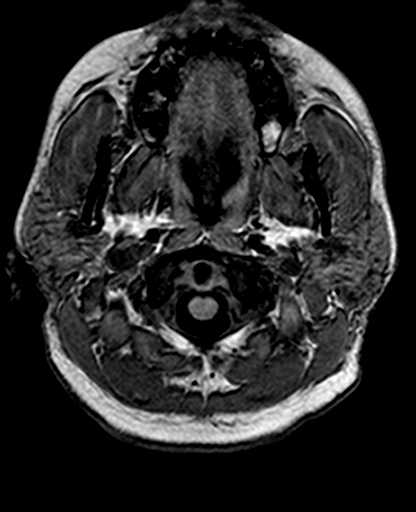
[im 9/70]
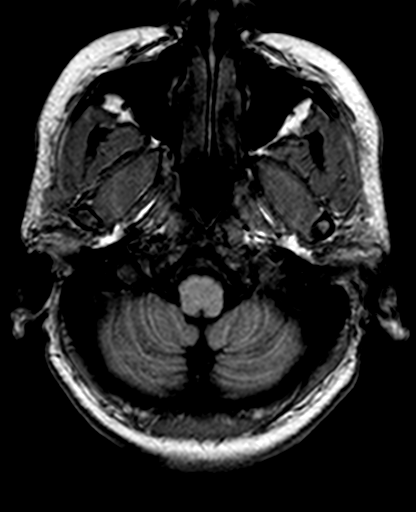
[im 18/70]
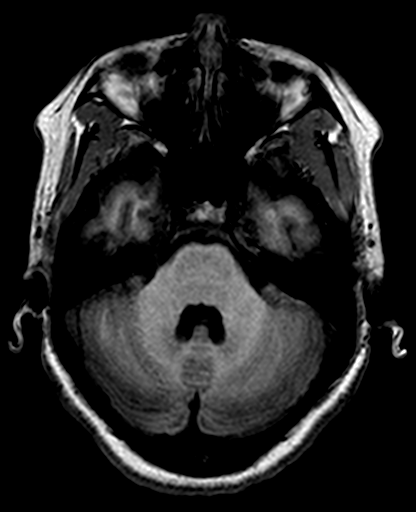
[im 26/70]
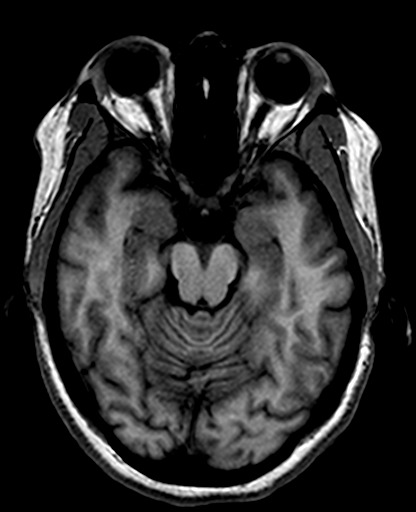
[im 44/70]
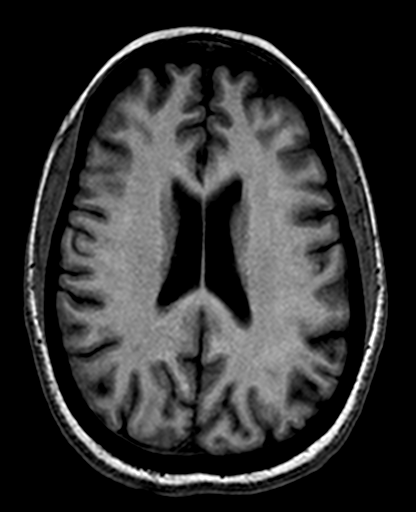
[im 52/70]
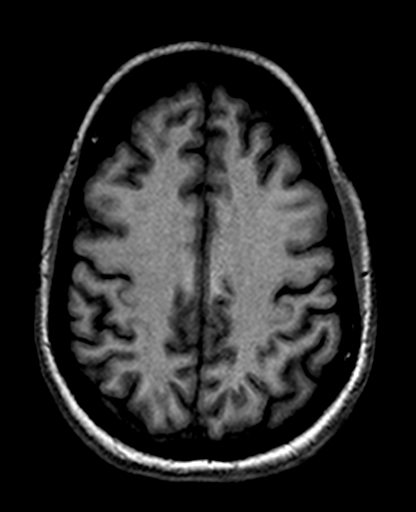
[im 61/70]
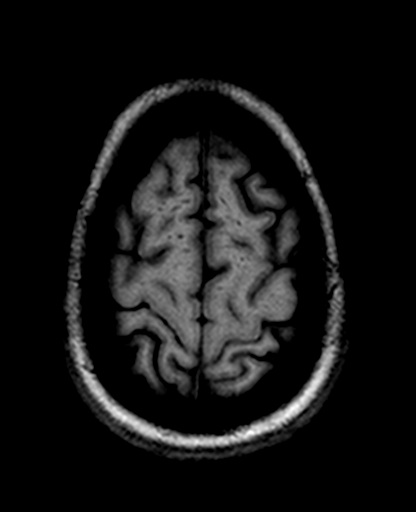
[im 70/70]
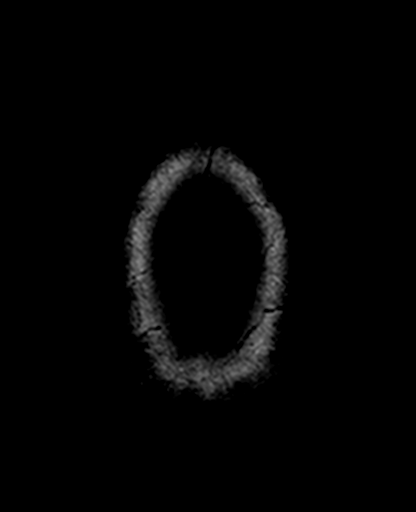

[Series 9: trauma axial · axial · 5.0mm · 0.38mm/px · 1 of 23 slices shown]
[im 1/23]
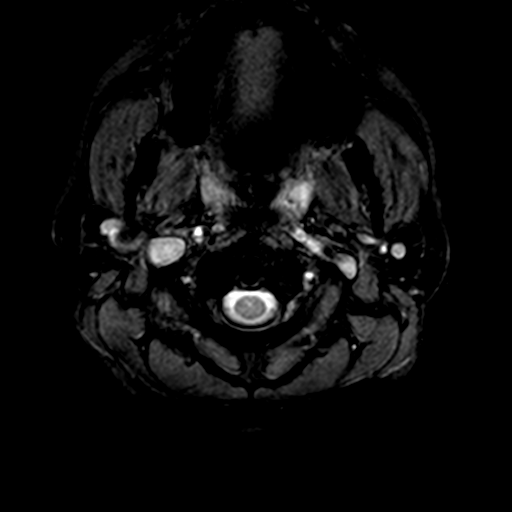

[Series 10: T2 · coronal · 5.0mm · 0.41mm/px · 4 of 28 slices shown (2 of 2)]
[im 1/28]
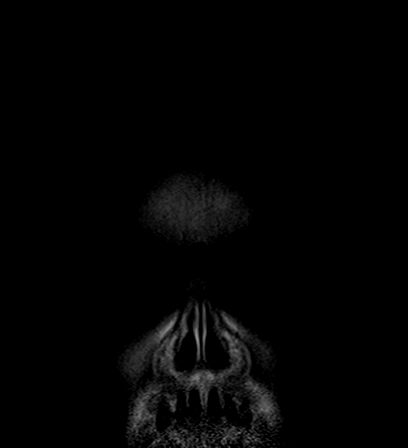
[im 10/28]
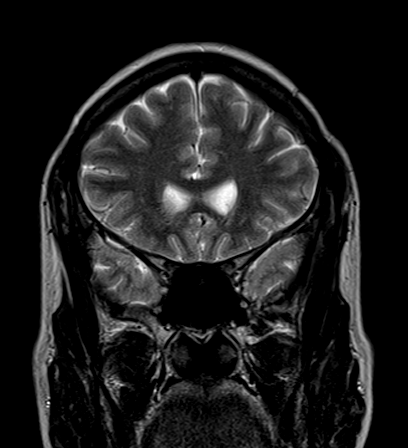
[im 19/28]
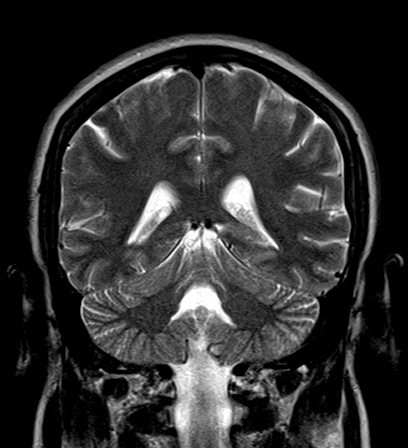
[im 28/28]
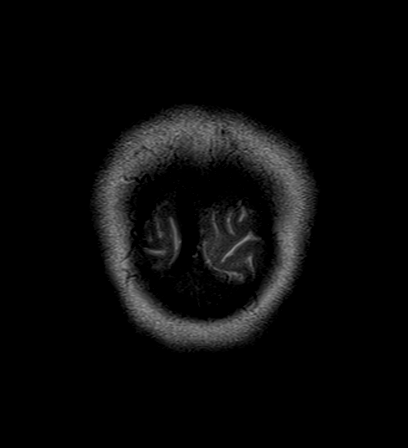

[21 of 48 positions shown; findings below may reference images not displayed]

FINDINGS: Cerebral volume is within normal limits. No restricted diffusion to
suggest acute infarction. No midline shift, mass effect, evidence of
mass lesion, ventriculomegaly, extra-axial collection or acute
intracranial hemorrhage. Cervicomedullary junction and pituitary are
within normal limits. Negative visualized cervical spine. Major
intracranial vascular flow voids are within normal limits.

Minimal to mild for age scattered small, mostly subcortical cerebral
white matter T2 and FLAIR hyperintense foci are nonspecific.
Otherwise normal gray and white matter signal. No cortical
encephalomalacia. No chronic cerebral blood products.

Grossly normal visualized internal auditory structures. Mastoids are
clear. Normal stylomastoid foramina.

Paranasal sinuses are clear. Orbits soft tissues appear normal.
Negative scalp soft tissues. Nonspecific but probable normal variant
hyperostosis of the posterior calvarium. Otherwise bone marrow
signal within normal limits.
IMPRESSION: 1.  No acute intracranial abnormality.
2. Minimal to mild for age nonspecific cerebral white matter signal
changes, significance doubtful.

## 2016-12-05 DIAGNOSIS — M25562 Pain in left knee: Secondary | ICD-10-CM | POA: Diagnosis not present

## 2016-12-05 DIAGNOSIS — M1711 Unilateral primary osteoarthritis, right knee: Secondary | ICD-10-CM | POA: Diagnosis not present

## 2017-01-28 ENCOUNTER — Ambulatory Visit (INDEPENDENT_AMBULATORY_CARE_PROVIDER_SITE_OTHER): Payer: Commercial Managed Care - HMO | Admitting: Family Medicine

## 2017-01-28 ENCOUNTER — Encounter: Payer: Self-pay | Admitting: Family Medicine

## 2017-01-28 VITALS — BP 116/70 | HR 72 | Temp 98.9°F | Resp 14 | Ht 60.0 in | Wt 200.0 lb

## 2017-01-28 DIAGNOSIS — R0981 Nasal congestion: Secondary | ICD-10-CM | POA: Diagnosis not present

## 2017-01-28 DIAGNOSIS — R011 Cardiac murmur, unspecified: Secondary | ICD-10-CM

## 2017-01-28 DIAGNOSIS — R0602 Shortness of breath: Secondary | ICD-10-CM | POA: Diagnosis not present

## 2017-01-28 NOTE — Patient Instructions (Addendum)
Get the chest xray  2D Echo to be done  F/U Pending results

## 2017-01-28 NOTE — Progress Notes (Signed)
   Subjective:    Patient ID: Charlotte Rice, female    DOB: 08-Dec-1960, 56 y.o.   MRN: [id number]  Patient presents for SOB upon Exertion (x4 weeks- states that she becomes SOB upon exertion while working- has recently returned to [employer] after being out for 15 months)  Pt here with Shortness of breath with exertion, has had for months but worse over past 4 weeks. Caveat she has returned to work which she has a lot of walking, has been out of work >1 year due to knee surgery and carpal tunnel. Denies chest pain or palpitations.Concerned how quickly she gives out of breath.   Has has some nasal congestion, dryness in nose, she is exposed to tobacco smoke.  No fever, no cough, no wheeze.       Review Of Systems:  GEN- denies fatigue, fever, weight loss,weakness, recent illness HEENT- denies eye drainage, change in vision, nasal discharge, CVS- denies chest pain, palpitations RESP-+SOB, cough, wheeze ABD- denies N/V, change in stools, abd pain GU- denies dysuria, hematuria, dribbling, incontinence MSK- denies joint pain, muscle aches, injury Neuro- denies headache, dizziness, syncope, seizure activity       Objective:    BP 116/70   Pulse 72   Temp 98.9 F (37.2 C) (Oral)   Resp 14   Ht 5' (1.524 m)   Wt 200 lb (90.7 kg)   SpO2 98%   BMI 39.06 kg/m  GEN- NAD, alert and oriented x3- walking sat 96% HEENT- PERRL, EOMI, non injected sclera, pink conjunctiva, MMM, oropharynx clear Neck- Supple, no thyromegaly CVS- RRR, 2/6 SEM  RESP-CTAB ABD-NABS,soft,NT,ND EXT- No edema Pulses- Radial, DP- 2+   EKG is normal      Assessment & Plan:      Problem List Items Addressed This Visit    None    Visit Diagnoses    SOB (shortness of breath)    -  Primary   SOB with exertion, defintely has deconditioning since she has been out of work, has picked up some weight. But has heart murmur obtain 2D Echo, EKG reasssuring, CXR has tobacco exposures    Relevant Orders   EKG 12-Lead  (Completed)   Nasal congestion       can use nasal saline, vaseline to nares       Note: This dictation was prepared with Dragon dictation along with smaller phrase technology. Any transcriptional errors that result from this process are unintentional.   h

## 2017-02-01 ENCOUNTER — Telehealth: Payer: Self-pay | Admitting: *Deleted

## 2017-02-01 NOTE — Telephone Encounter (Signed)
Received VM from patient.   States that she is returning your call.

## 2017-02-01 NOTE — Telephone Encounter (Signed)
Calling about Echocardiogram, pt aware of appt.

## 2017-02-06 ENCOUNTER — Encounter (HOSPITAL_COMMUNITY): Payer: Self-pay | Admitting: *Deleted

## 2017-02-06 ENCOUNTER — Ambulatory Visit (HOSPITAL_COMMUNITY)
Admission: RE | Admit: 2017-02-06 | Discharge: 2017-02-06 | Disposition: A | Discharge status: Home / Self Care | Payer: Commercial Managed Care - HMO | Source: Ambulatory Visit | Attending: Family Medicine | Admitting: Family Medicine

## 2017-02-06 DIAGNOSIS — I700 Atherosclerosis of aorta: Secondary | ICD-10-CM | POA: Insufficient documentation

## 2017-02-06 DIAGNOSIS — R0602 Shortness of breath: Secondary | ICD-10-CM | POA: Diagnosis not present

## 2017-02-06 DIAGNOSIS — R011 Cardiac murmur, unspecified: Secondary | ICD-10-CM

## 2017-02-06 DIAGNOSIS — I340 Nonrheumatic mitral (valve) insufficiency: Secondary | ICD-10-CM | POA: Diagnosis not present

## 2017-02-06 NOTE — Progress Notes (Signed)
*  PRELIMINARY RESULTS* Echocardiogram 2D Echocardiogram has been performed.  Leavy Cella 02/06/2017, 9:41 AM

## 2017-02-08 ENCOUNTER — Ambulatory Visit
Admission: RE | Admit: 2017-02-08 | Discharge: 2017-02-08 | Disposition: A | Discharge status: Home / Self Care | Payer: Commercial Managed Care - HMO | Source: Ambulatory Visit | Attending: Family Medicine | Admitting: Family Medicine

## 2017-02-08 DIAGNOSIS — R0602 Shortness of breath: Secondary | ICD-10-CM

## 2017-05-20 ENCOUNTER — Other Ambulatory Visit: Payer: Self-pay | Admitting: Adult Health

## 2017-05-20 DIAGNOSIS — Z1231 Encounter for screening mammogram for malignant neoplasm of breast: Secondary | ICD-10-CM

## 2017-05-24 ENCOUNTER — Ambulatory Visit (HOSPITAL_COMMUNITY)
Admission: RE | Admit: 2017-05-24 | Discharge: 2017-05-24 | Disposition: A | Discharge status: Home / Self Care | Payer: 59 | Source: Ambulatory Visit | Attending: Adult Health | Admitting: Adult Health

## 2017-05-24 DIAGNOSIS — Z1231 Encounter for screening mammogram for malignant neoplasm of breast: Secondary | ICD-10-CM | POA: Diagnosis present

## 2017-07-04 ENCOUNTER — Encounter: Payer: Self-pay | Admitting: Family Medicine

## 2017-07-22 DIAGNOSIS — H524 Presbyopia: Secondary | ICD-10-CM | POA: Diagnosis not present

## 2017-07-22 DIAGNOSIS — H5213 Myopia, bilateral: Secondary | ICD-10-CM | POA: Diagnosis not present

## 2017-10-24 DIAGNOSIS — M65311 Trigger thumb, right thumb: Secondary | ICD-10-CM | POA: Diagnosis not present

## 2017-11-14 DIAGNOSIS — M79645 Pain in left finger(s): Secondary | ICD-10-CM | POA: Diagnosis not present

## 2017-11-14 DIAGNOSIS — M65312 Trigger thumb, left thumb: Secondary | ICD-10-CM | POA: Diagnosis not present

## 2018-01-16 DIAGNOSIS — M79642 Pain in left hand: Secondary | ICD-10-CM | POA: Insufficient documentation

## 2018-01-16 DIAGNOSIS — M6530 Trigger finger, unspecified finger: Secondary | ICD-10-CM | POA: Insufficient documentation

## 2018-01-17 DIAGNOSIS — M79642 Pain in left hand: Secondary | ICD-10-CM | POA: Diagnosis not present

## 2018-01-17 DIAGNOSIS — M65312 Trigger thumb, left thumb: Secondary | ICD-10-CM | POA: Diagnosis not present

## 2018-02-19 ENCOUNTER — Encounter: Payer: Self-pay | Admitting: Family Medicine

## 2018-02-19 ENCOUNTER — Other Ambulatory Visit: Payer: Self-pay

## 2018-02-19 ENCOUNTER — Ambulatory Visit (INDEPENDENT_AMBULATORY_CARE_PROVIDER_SITE_OTHER): Payer: 59 | Admitting: Family Medicine

## 2018-02-19 VITALS — BP 132/78 | HR 68 | Temp 98.2°F | Resp 16 | Ht 60.0 in | Wt 192.0 lb

## 2018-02-19 DIAGNOSIS — S29012A Strain of muscle and tendon of back wall of thorax, initial encounter: Secondary | ICD-10-CM | POA: Diagnosis not present

## 2018-02-19 MED ORDER — NAPROXEN 500 MG PO TABS: 500.0000 mg | ORAL_TABLET | Freq: Two times a day (BID) | ORAL | 0 refills | Status: DC

## 2018-02-19 MED ORDER — CYCLOBENZAPRINE HCL 5 MG PO TABS: 5.0000 mg | ORAL_TABLET | Freq: Three times a day (TID) | ORAL | 0 refills | Status: DC | PRN

## 2018-02-19 NOTE — Progress Notes (Signed)
   Subjective:    Patient ID: Charlotte Rice, female    DOB: 12/25/1960, 57 y.o.   MRN: [id number]  Patient presents for Back Pain (x5 days- states that she has pain in L shoulder blade region and some tenderness to ribs- denies injury)  Last Thursday, was at security check in for work, she was opening her purse and felt a pain at left shoulder blade that is improved but still persisting  Taking advil, tried heat, advil does hellp Has had some pain with taking deep breath, feels it toward left side at same spot No shoulder pain or lower back pain, neck pain   Review Of Systems:  GEN- denies fatigue, fever, weight loss,weakness, recent illness HEENT- denies eye drainage, change in vision, nasal discharge, CVS- denies chest pain, palpitations RESP- denies SOB, cough, wheeze ABD- denies N/V, change in stools, abd pain GU- denies dysuria, hematuria, dribbling, incontinence MSK- +joint pain, muscle aches, injury Neuro- denies headache, dizziness, syncope, seizure activity       Objective:    BP 132/78   Pulse 68   Temp 98.2 F (36.8 C) (Oral)   Resp 16   Ht 5' (1.524 m)   Wt 192 lb (87.1 kg)   SpO2 97%   BMI 37.50 kg/m  GEN- NAD, alert and oriented x3 CVS- RRR. No murmur RESP- CTAB MSK- TTP left shoulder blade, FROM bilat UE, no winged scapula,FROM neck, Spine NT, strength equal bilat UE Skin in TACT       Assessment & Plan:      Problem List Items Addressed This Visit    None    Visit Diagnoses    Upper back strain, initial encounter    -  Primary   it is improving, change to naprosyn BID, given flexeril for evening, can use topical rub/massage, expect improvement in next 2-3 weeks      Note: This dictation was prepared with Dragon dictation along with smaller phrase technology. Any transcriptional errors that result from this process are unintentional.

## 2018-02-19 NOTE — Patient Instructions (Addendum)
Schedule a physical  Take naprosyn, use muscle relaxer

## 2018-02-20 ENCOUNTER — Encounter: Payer: Self-pay | Admitting: Family Medicine

## 2018-05-22 DIAGNOSIS — M79642 Pain in left hand: Secondary | ICD-10-CM | POA: Diagnosis not present

## 2018-05-22 DIAGNOSIS — M65312 Trigger thumb, left thumb: Secondary | ICD-10-CM | POA: Diagnosis not present

## 2018-05-23 ENCOUNTER — Ambulatory Visit: Payer: 59 | Admitting: Podiatry

## 2018-05-23 ENCOUNTER — Encounter: Payer: Self-pay | Admitting: Podiatry

## 2018-05-23 ENCOUNTER — Ambulatory Visit (INDEPENDENT_AMBULATORY_CARE_PROVIDER_SITE_OTHER): Payer: 59

## 2018-05-23 VITALS — BP 142/83 | HR 59 | Resp 16

## 2018-05-23 DIAGNOSIS — M779 Enthesopathy, unspecified: Secondary | ICD-10-CM | POA: Diagnosis not present

## 2018-05-23 DIAGNOSIS — M79672 Pain in left foot: Secondary | ICD-10-CM

## 2018-05-23 DIAGNOSIS — M722 Plantar fascial fibromatosis: Secondary | ICD-10-CM

## 2018-05-23 LAB — HEPATIC FUNCTION PANEL
AG Ratio: 1.3 (calc) (ref 1.0–2.5)
ALBUMIN MSPROF: 4.3 g/dL (ref 3.6–5.1)
ALT: 18 U/L (ref 6–29)
AST: 20 U/L (ref 10–35)
Alkaline phosphatase (APISO): 129 U/L (ref 33–130)
BILIRUBIN TOTAL: 0.3 mg/dL (ref 0.2–1.2)
Bilirubin, Direct: 0.1 mg/dL (ref 0.0–0.2)
GLOBULIN: 3.3 g/dL (ref 1.9–3.7)
Indirect Bilirubin: 0.2 mg/dL (calc) (ref 0.2–1.2)
Total Protein: 7.6 g/dL (ref 6.1–8.1)

## 2018-05-23 MED ORDER — TERBINAFINE HCL 250 MG PO TABS: 250.0000 mg | ORAL_TABLET | Freq: Every day | ORAL | 0 refills | Status: DC

## 2018-05-23 NOTE — Progress Notes (Signed)
   Subjective:    Patient ID: Charlotte Rice, female    DOB: [birth year], 59 y.o.   MRN: [id number]  HPI    Review of Systems  All other systems reviewed and are negative.      Objective:   Physical Exam        Assessment & Plan:

## 2018-05-23 NOTE — Progress Notes (Signed)
Subjective:   Patient ID: Charlotte Rice, female   DOB: 57 y.o.   MRN: [id number]   HPI Patient presents stating she is had a lot of pain in her foot left over right and it hurts on the bottom of the top and at times it is real sore and other times is mild.  States she works on [employer] and patient does not currently smoke and likes to be active   Review of Systems  All other systems reviewed and are negative.       Objective:  Physical Exam  Constitutional: She appears well-developed and well-nourished.  Cardiovascular: Intact distal pulses.  Pulmonary/Chest: Effort normal.  Musculoskeletal: Normal range of motion.  Neurological: She is alert.  Skin: Skin is warm.  Nursing note and vitals reviewed.   Neurovascular status found to be intact with muscle strength adequate range of motion within normal limits patient is noted to have mild discomfort dorsal foot discomfort plantar aspect heel and into the midfoot with moderate depression of the arch noted left over right     Assessment:  Tendinitis of the foot left secondary to foot structure with cement floors and 12-hour days contributing factor     Plan:  H&P x-rays reviewed and today [place] type orthotic casting performed in order to reduce the pressure on the plantar foot.  Advised this patient on anti-inflammatories physical therapy and possibility for other treatments depending on response  X-rays indicate moderate depression of the arch bilateral with mild signs of arthritis bilateral

## 2018-06-12 ENCOUNTER — Telehealth: Payer: Self-pay | Admitting: Podiatry

## 2018-06-12 NOTE — Telephone Encounter (Signed)
Pt left message returning my call to make an appt to pick up orthotics.   I returned call and left message we have available appts tues morning or afternoon, wedneday afternoon only,and Thursday morning or afternoon,

## 2018-06-23 ENCOUNTER — Ambulatory Visit: Payer: 59 | Admitting: Orthotics

## 2018-06-23 DIAGNOSIS — M722 Plantar fascial fibromatosis: Secondary | ICD-10-CM

## 2018-06-23 DIAGNOSIS — M779 Enthesopathy, unspecified: Secondary | ICD-10-CM

## 2018-06-23 DIAGNOSIS — M79672 Pain in left foot: Secondary | ICD-10-CM

## 2018-06-23 NOTE — Progress Notes (Signed)
Patient came in today to pick up custom made foot orthotics.  The goals were accomplished and the patient reported no dissatisfaction with said orthotics.  Patient was advised of breakin period and how to report any issues. 

## 2018-07-31 DIAGNOSIS — H524 Presbyopia: Secondary | ICD-10-CM | POA: Diagnosis not present

## 2018-07-31 DIAGNOSIS — H5213 Myopia, bilateral: Secondary | ICD-10-CM | POA: Diagnosis not present

## 2018-09-03 DIAGNOSIS — M25562 Pain in left knee: Secondary | ICD-10-CM | POA: Diagnosis not present

## 2018-09-03 DIAGNOSIS — M25561 Pain in right knee: Secondary | ICD-10-CM | POA: Diagnosis not present

## 2018-09-05 ENCOUNTER — Encounter: Payer: Self-pay | Admitting: Podiatry

## 2018-09-05 ENCOUNTER — Ambulatory Visit (INDEPENDENT_AMBULATORY_CARE_PROVIDER_SITE_OTHER): Payer: 59 | Admitting: Podiatry

## 2018-09-05 ENCOUNTER — Ambulatory Visit (INDEPENDENT_AMBULATORY_CARE_PROVIDER_SITE_OTHER): Payer: 59

## 2018-09-05 DIAGNOSIS — M779 Enthesopathy, unspecified: Secondary | ICD-10-CM | POA: Diagnosis not present

## 2018-09-05 DIAGNOSIS — M722 Plantar fascial fibromatosis: Secondary | ICD-10-CM | POA: Diagnosis not present

## 2018-09-05 MED ORDER — TRIAMCINOLONE ACETONIDE 10 MG/ML IJ SUSP
10.0000 mg | Freq: Once | INTRAMUSCULAR | Status: AC
  Administered 2018-09-05: 10 mg

## 2018-09-05 NOTE — Progress Notes (Signed)
Subjective:   Patient ID: Charlotte Rice, female   DOB: 57 y.o.   MRN: [id number]   HPI Patient presents stating that my right heel has really started to hurt me and the right orthotic does not seem to fit well despite getting new shoes 3 weeks ago   ROS      Objective:  Physical Exam  Neurovascular status intact with inflammation pain in the medial band right heel at the insertion and orthotics which may be giving her some fitting issues in the midfoot and arch     Assessment:  Acute plantar fasciitis right with possible malfitting orthotic     Plan:  Reviewed condition and today I did careful steroid injection of the medial band 3 mg Kenalog 5 Milgram Xylocaine at insertion and I am referring back to ped orthotist for adjustment of right orthotic.  See me back as needed

## 2018-09-05 NOTE — Patient Instructions (Signed)

## 2018-09-18 ENCOUNTER — Ambulatory Visit: Payer: 59 | Admitting: Orthotics

## 2018-09-18 ENCOUNTER — Encounter: Payer: Self-pay | Admitting: Orthotics

## 2018-09-18 DIAGNOSIS — M722 Plantar fascial fibromatosis: Secondary | ICD-10-CM

## 2018-09-18 DIAGNOSIS — M79672 Pain in left foot: Secondary | ICD-10-CM

## 2018-09-18 DIAGNOSIS — M779 Enthesopathy, unspecified: Secondary | ICD-10-CM

## 2018-09-18 NOTE — Progress Notes (Signed)
Reduced bulk from the distal end of shell to make met pad feel better.

## 2018-10-06 DIAGNOSIS — M1711 Unilateral primary osteoarthritis, right knee: Secondary | ICD-10-CM | POA: Diagnosis not present

## 2018-10-13 DIAGNOSIS — M1711 Unilateral primary osteoarthritis, right knee: Secondary | ICD-10-CM | POA: Diagnosis not present

## 2018-10-15 ENCOUNTER — Other Ambulatory Visit: Payer: Self-pay | Admitting: Podiatry

## 2018-10-20 DIAGNOSIS — M1711 Unilateral primary osteoarthritis, right knee: Secondary | ICD-10-CM | POA: Diagnosis not present

## 2018-10-29 DIAGNOSIS — M1712 Unilateral primary osteoarthritis, left knee: Secondary | ICD-10-CM | POA: Diagnosis not present

## 2018-11-05 DIAGNOSIS — M1712 Unilateral primary osteoarthritis, left knee: Secondary | ICD-10-CM | POA: Diagnosis not present

## 2018-11-12 DIAGNOSIS — M1712 Unilateral primary osteoarthritis, left knee: Secondary | ICD-10-CM | POA: Diagnosis not present

## 2018-11-14 ENCOUNTER — Encounter: Payer: Self-pay | Admitting: Podiatry

## 2018-11-14 ENCOUNTER — Ambulatory Visit: Payer: 59 | Admitting: Podiatry

## 2018-11-14 DIAGNOSIS — M779 Enthesopathy, unspecified: Secondary | ICD-10-CM

## 2018-11-14 MED ORDER — TRIAMCINOLONE ACETONIDE 10 MG/ML IJ SUSP
10.0000 mg | Freq: Once | INTRAMUSCULAR | Status: AC
  Administered 2018-11-14: 10 mg

## 2018-11-15 NOTE — Progress Notes (Signed)
Subjective:   Patient ID: Charlotte Rice, female   DOB: 57 y.o.   MRN: [id number]   HPI Patient presents stating I am still having pain in the outside of the left but it is improved from previous   ROS      Objective:  Physical Exam  Neurovascular status intact with patient's left peroneal tendon improved but still painful when pressed     Assessment:  Acute tendinitis of the left peroneal tendon that improved with one area still sore in a slightly different position than previously     Plan:  Reviewed condition and importance of rigid bottom shoes and went ahead today and did sheath injection left 3 mg Kenalog 5 mg Xylocaine advised on ice therapy and reappoint for [place] to recheck

## 2019-03-16 ENCOUNTER — Ambulatory Visit (INDEPENDENT_AMBULATORY_CARE_PROVIDER_SITE_OTHER): Payer: 59

## 2019-03-16 ENCOUNTER — Ambulatory Visit: Payer: 59 | Admitting: Podiatry

## 2019-03-16 ENCOUNTER — Other Ambulatory Visit: Payer: Self-pay | Admitting: Podiatry

## 2019-03-16 ENCOUNTER — Other Ambulatory Visit: Payer: Self-pay

## 2019-03-16 ENCOUNTER — Encounter: Payer: Self-pay | Admitting: Podiatry

## 2019-03-16 VITALS — Temp 97.7°F

## 2019-03-16 DIAGNOSIS — M79671 Pain in right foot: Secondary | ICD-10-CM | POA: Diagnosis not present

## 2019-03-16 DIAGNOSIS — M722 Plantar fascial fibromatosis: Secondary | ICD-10-CM

## 2019-03-16 DIAGNOSIS — M779 Enthesopathy, unspecified: Secondary | ICD-10-CM

## 2019-03-16 DIAGNOSIS — M79672 Pain in left foot: Principal | ICD-10-CM

## 2019-03-16 MED ORDER — TRIAMCINOLONE ACETONIDE 10 MG/ML IJ SUSP
10.0000 mg | Freq: Once | INTRAMUSCULAR | Status: AC
  Administered 2019-03-16: 17:00:00 10 mg

## 2019-03-16 MED ORDER — DICLOFENAC SODIUM 75 MG PO TBEC: 75.0000 mg | DELAYED_RELEASE_TABLET | Freq: Two times a day (BID) | ORAL | 2 refills | Status: DC

## 2019-03-16 NOTE — Patient Instructions (Signed)

## 2019-03-16 NOTE — Progress Notes (Signed)
Subjective:   Patient ID: Charlotte Rice, female   DOB: 58 y.o.   MRN: [id number]   HPI Patient presents stating my right heel has got very sore over the last month and I have had a problem in the past and has been wearing my orthotics at work.  Patient states is worse when she gets up in the morning after sitting and states the left ankle has been sore but she is not able to identify an area that it hurts and states it seems to be periodic    ROS      Objective:  Physical Exam  Acute plantar fasciitis right with inflammation fluid of the medial band with what appears to be possible mild discomfort of the left dorsal ankle difficult to make a complete determination     Assessment:  Fasciitis symptoms and possible ankle capsulitis or other pathology     Plan:  Get a focus on the heel and I did inject the plantar fascial 3 mg Kenalog 5 mg Xylocaine and dispensed night splint to stretch the foot and take pressure off the arch.  I then gave instructions for anti-inflammatories physical therapy and ice therapy and will be seen back to recheck and may require another pair of orthotics at one point in future  X-rays indicate no indications of stress fracture or acute inflammatory process or spur formation in the heel

## 2019-03-30 ENCOUNTER — Encounter: Payer: Self-pay | Admitting: Podiatry

## 2019-03-30 ENCOUNTER — Other Ambulatory Visit: Payer: Self-pay

## 2019-03-30 ENCOUNTER — Ambulatory Visit (INDEPENDENT_AMBULATORY_CARE_PROVIDER_SITE_OTHER): Payer: 59 | Admitting: Podiatry

## 2019-03-30 VITALS — Temp 97.9°F

## 2019-03-30 DIAGNOSIS — M21619 Bunion of unspecified foot: Secondary | ICD-10-CM | POA: Diagnosis not present

## 2019-03-30 DIAGNOSIS — M79672 Pain in left foot: Secondary | ICD-10-CM | POA: Diagnosis not present

## 2019-03-30 DIAGNOSIS — M722 Plantar fascial fibromatosis: Secondary | ICD-10-CM | POA: Diagnosis not present

## 2019-03-30 NOTE — Progress Notes (Signed)
Subjective:   Patient ID: Charlotte Rice, female   DOB: 58 y.o.   MRN: [id number]   HPI Patient states doing well but still getting some pain if she does too much in the left top of the foot can bother her at different times.  States overall the heel is improved   ROS      Objective:  Physical Exam  Neurovascular status intact with patient's right plantar heel much improved over previous and the left dorsal foot sore but improved from previous treatments     Assessment:  Acute fasciitis right improved at the current time with tendinitis left and also structural bunion deformity noted     Plan:  H&P discussed conditions and at this point I recommended conservative treatment consisting of physical therapy anti-inflammatory shoe gear modification and applied padding second digit left.  Reappoint as needed may require other treatment processes

## 2019-07-20 ENCOUNTER — Other Ambulatory Visit: Payer: Self-pay

## 2019-07-20 ENCOUNTER — Ambulatory Visit (INDEPENDENT_AMBULATORY_CARE_PROVIDER_SITE_OTHER): Payer: 59 | Admitting: Family Medicine

## 2019-07-20 ENCOUNTER — Encounter: Payer: Self-pay | Admitting: Family Medicine

## 2019-07-20 VITALS — BP 120/64 | HR 70 | Temp 98.9°F | Resp 17 | Ht 60.0 in | Wt 185.0 lb

## 2019-07-20 DIAGNOSIS — M25551 Pain in right hip: Secondary | ICD-10-CM

## 2019-07-20 DIAGNOSIS — G43709 Chronic migraine without aura, not intractable, without status migrainosus: Secondary | ICD-10-CM | POA: Diagnosis not present

## 2019-07-20 DIAGNOSIS — M5410 Radiculopathy, site unspecified: Secondary | ICD-10-CM

## 2019-07-20 MED ORDER — TRAMADOL HCL 50 MG PO TABS: 50.0000 mg | ORAL_TABLET | Freq: Three times a day (TID) | ORAL | 0 refills | Status: AC | PRN

## 2019-07-20 MED ORDER — SUMATRIPTAN SUCCINATE 100 MG PO TABS: 100.0000 mg | ORAL_TABLET | ORAL | 1 refills | Status: AC | PRN

## 2019-07-20 NOTE — Patient Instructions (Signed)
F/U pending results   

## 2019-07-20 NOTE — Progress Notes (Signed)
Subjective:    Patient ID: Charlotte Rice, female    DOB: 06-06-1961, 58 y.o.   MRN: [id number]  Patient presents for Migraines (increased boujts of HA), Neuropathy (states that she feels like there is somehting crawling all over her), and Hip/ Back Pain (has been seen at ortho for knee, but hip and back continue to hurt)   Seen by Dr. Theda Sers planning for right knee replacement near future, has a new orthotic brace.    Right hip pain cause pain at night. Right leg feelslike there is crawling/tingling sensation foot/ankle region mostly. Denies any numbness in feet  This has been worsening over past few months   Also has chronic right sided back pain    Naprosyn 500mg  BID from orthopedics    Headaches-  history of migraine sstill gets vertigo like sensation will get nausea with headache last 2-3 days. This time just stood up and then it hit her . Still has mild headache today   She noticed when she goes off caffiene has worsening headaches         Review Of Systems:  GEN- denies fatigue, fever, weight loss,weakness, recent illness HEENT- denies eye drainage, change in vision, nasal discharge, CVS- denies chest pain, palpitations RESP- denies SOB, cough, wheeze ABD- denies N/V, change in stools, abd pain GU- denies dysuria, hematuria, dribbling, incontinence MSK- denies joint pain, muscle aches, injury Neuro- + headache, dizziness, syncope, seizure activity       Objective:    BP 120/64   Pulse 70   Temp 98.9 F (37.2 C) (Oral)   Resp 17   Ht 5' (1.524 m)   Wt 185 lb (83.9 kg)   SpO2 96%   BMI 36.13 kg/m  GEN- NAD, alert and oriented x3 HEENT- PERRL, EOMI, non injected sclera, pink conjunctiva, MMM, oropharynx clear Neck- Supple, no thyromegaly CVS- RRR, no murmur RESP-CTAB ABD-NABS,soft,NT,ND MSK- TTP right lumbar spine and paraspinals, mild TTP over right hip, fair ROM spine, hips/knees, right knee in brace, neg SLR bilat Neuro-normal tone, antalgic gait ,  normal tone LE , sensation grossly in tact  EXT- No edema, varicose veins bilat  Pulses- Radial, DP- 2+        Assessment & Plan:      Problem List Items Addressed This Visit      Unprioritized   Migraines - Primary    In past on imitrex prn, will restart Also discussed caffiene effects on migraines      Relevant Medications   SUMAtriptan (IMITREX) 100 MG tablet   traMADol (ULTRAM) 50 MG tablet    Other Visit Diagnoses    Back pain with right-sided radiculopathy       obtain xray spine/hip, with her "bad" knee difficult to tell if this is just positional overcompensation pain or other pathology. Skin sensation could be a from her back.  Other possibilities secondary to her varicose veins.  We will see what her imaging looks like first and go from there.  She is already established at an orthopedic clinic so we can call them and when needed.  I have given her tramadol for the back pain is the anti-inflammatories are not helping , she is not to take this while working   Relevant Medications   traMADol (ULTRAM) 50 MG tablet   Other Relevant Orders   DG Lumbar Spine Complete   Right hip pain       Relevant Orders   DG Hip Unilat W OR W/O Pelvis 2-3  Views Right      Note: This dictation was prepared with Dragon dictation along with smaller phrase technology. Any transcriptional errors that result from this process are unintentional.

## 2019-07-20 NOTE — Assessment & Plan Note (Signed)
In past on imitrex prn, will restart Also discussed caffiene effects on migraines

## 2019-07-21 ENCOUNTER — Ambulatory Visit (HOSPITAL_COMMUNITY)
Admission: RE | Admit: 2019-07-21 | Discharge: 2019-07-21 | Disposition: A | Discharge status: Home / Self Care | Payer: 59 | Source: Ambulatory Visit | Attending: Family Medicine | Admitting: Family Medicine

## 2019-07-21 DIAGNOSIS — M25551 Pain in right hip: Secondary | ICD-10-CM | POA: Diagnosis present

## 2019-07-21 DIAGNOSIS — M5410 Radiculopathy, site unspecified: Secondary | ICD-10-CM | POA: Diagnosis present

## 2019-07-23 DIAGNOSIS — M25531 Pain in right wrist: Secondary | ICD-10-CM | POA: Insufficient documentation

## 2019-07-27 ENCOUNTER — Other Ambulatory Visit: Payer: Self-pay | Admitting: Family Medicine

## 2019-07-27 DIAGNOSIS — M5136 Other intervertebral disc degeneration, lumbar region: Secondary | ICD-10-CM

## 2019-07-27 DIAGNOSIS — M5410 Radiculopathy, site unspecified: Secondary | ICD-10-CM

## 2019-08-07 DIAGNOSIS — M5136 Other intervertebral disc degeneration, lumbar region: Secondary | ICD-10-CM | POA: Insufficient documentation

## 2019-12-17 ENCOUNTER — Ambulatory Visit: Payer: 59 | Admitting: Podiatry

## 2019-12-17 ENCOUNTER — Encounter: Payer: Self-pay | Admitting: Podiatry

## 2019-12-17 ENCOUNTER — Ambulatory Visit (INDEPENDENT_AMBULATORY_CARE_PROVIDER_SITE_OTHER): Payer: 59

## 2019-12-17 ENCOUNTER — Other Ambulatory Visit: Payer: Self-pay

## 2019-12-17 DIAGNOSIS — M79672 Pain in left foot: Secondary | ICD-10-CM

## 2019-12-17 DIAGNOSIS — M7672 Peroneal tendinitis, left leg: Secondary | ICD-10-CM | POA: Diagnosis not present

## 2019-12-17 DIAGNOSIS — M21619 Bunion of unspecified foot: Secondary | ICD-10-CM | POA: Diagnosis not present

## 2019-12-18 ENCOUNTER — Other Ambulatory Visit: Payer: Self-pay | Admitting: Podiatry

## 2019-12-18 DIAGNOSIS — M7672 Peroneal tendinitis, left leg: Secondary | ICD-10-CM

## 2019-12-18 DIAGNOSIS — M21619 Bunion of unspecified foot: Secondary | ICD-10-CM

## 2019-12-22 NOTE — Progress Notes (Signed)
Subjective:   Patient ID: Charlotte Rice, female   DOB: 59 y.o.   MRN: [id number]   HPI Patient states having a lot of pain in the outside of the left foot and is also having some trouble with the dorsal and inside of the foot that she is concerned about.  States is been quite sore    ROS      Objective:  Physical Exam  Neurovascular status intact muscle strength was adequate 2 different areas of discomfort with 1 being dorsal foot and the other being on the outside of the foot with inflammation fluid of the tendon complex.     Assessment:  Dorsal tendinitis left with a mild to moderate nature with exquisite peroneal tendinitis left at insertion base of fifth metatarsal with no indications of tendon dysfunction     Plan:  Several points of problem discussed the dorsal tendinitis recommended ice therapy and for the lateral foot I did do sterile prep and injected the insertion 3 mg Dexasone Kenalog 5 mg Xylocaine advised on reduced activity reappoint 2 weeks  X-rays indicate no signs of stress fracture or advanced arthritis appears to be soft tissue injury

## 2019-12-23 IMAGING — DX DG HIP (WITH OR WITHOUT PELVIS) 2-3V RIGHT
3 series · 3 of 3 positions shown · non-contrast
Comparison: None.

CLINICAL DATA: Low back, right hip and right leg pain for 3 months.
No known injury.

EXAM:
DG HIP (WITH OR WITHOUT PELVIS) 2-3V RIGHT

[pelvis ap]
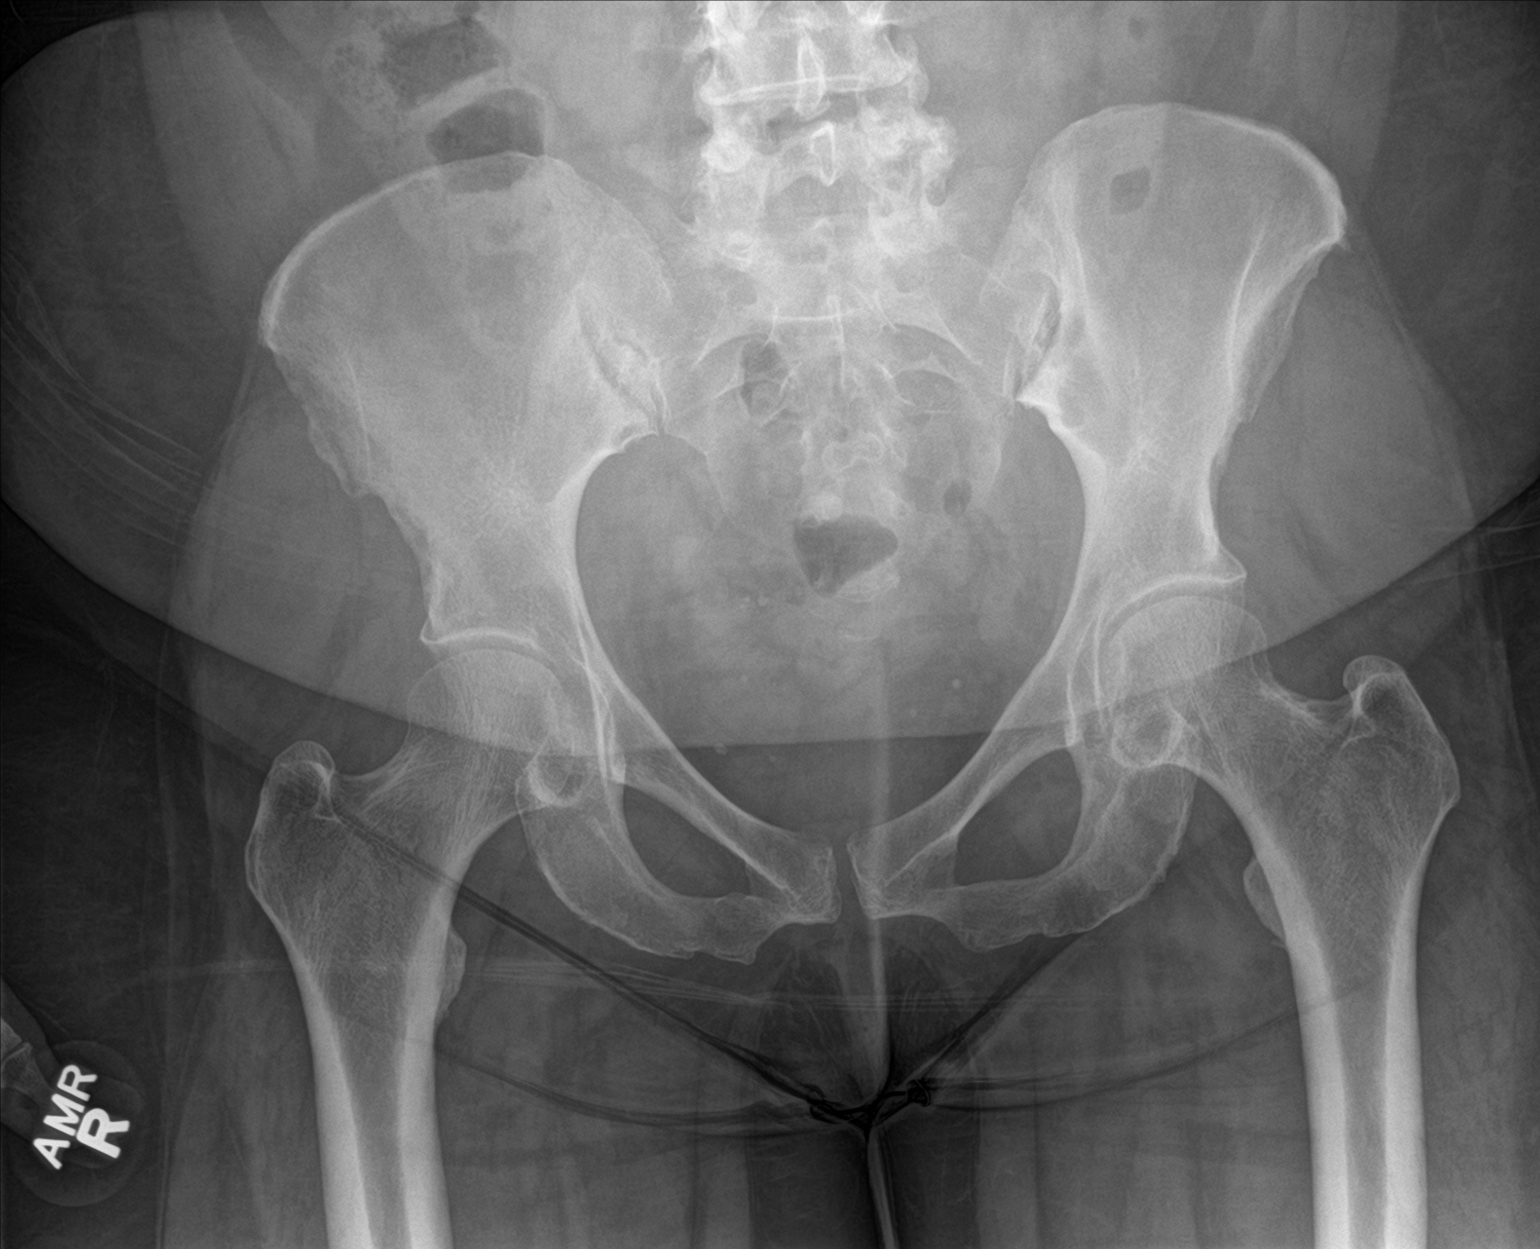

[hip ap]
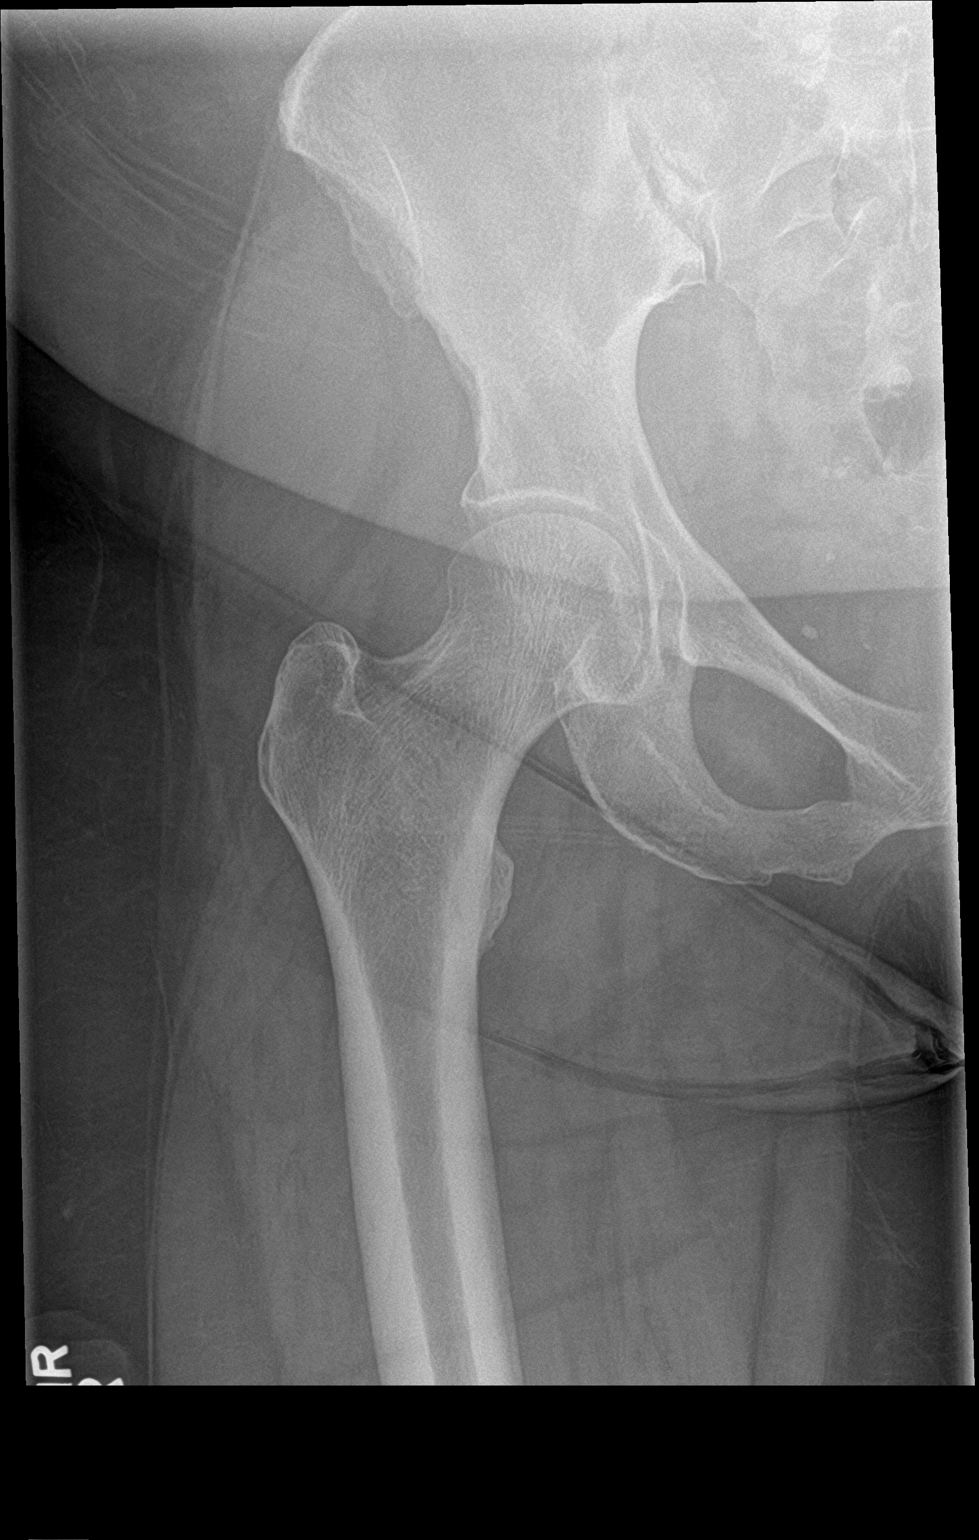

[hip frog leg]
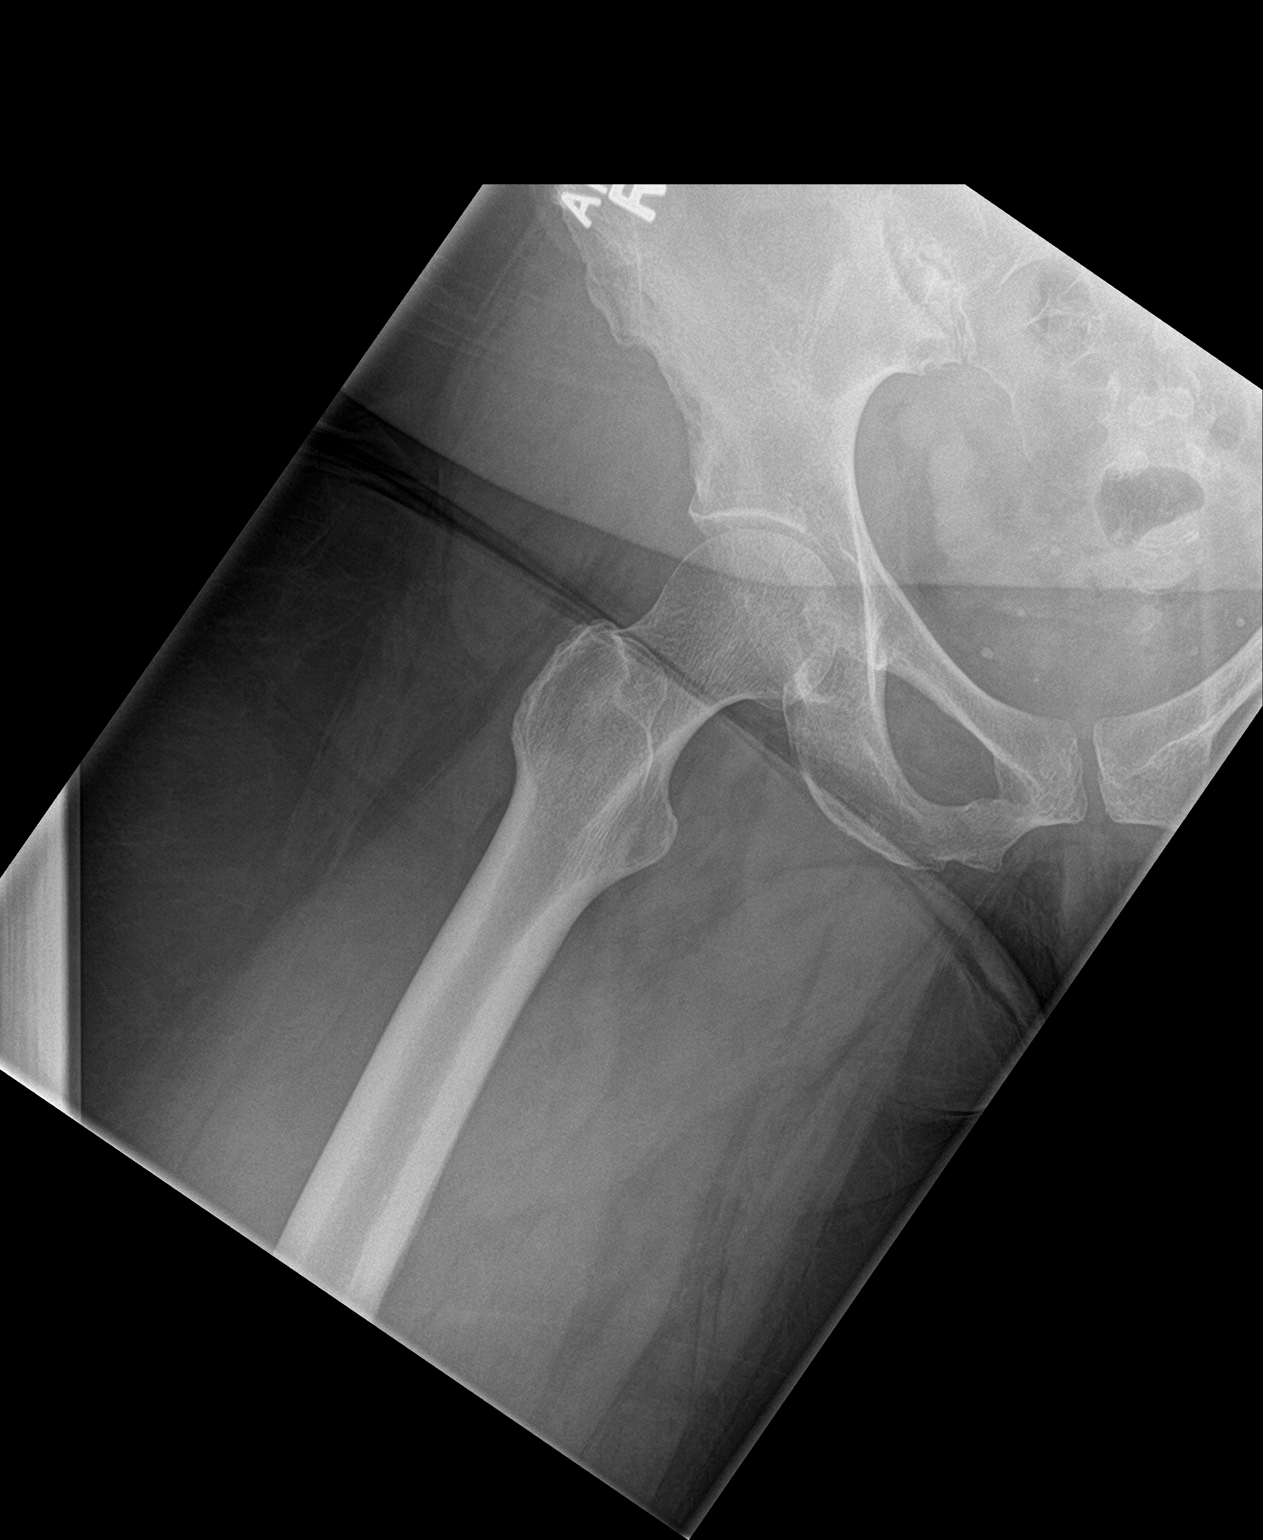

[3 of 3 positions shown; findings below may reference images not displayed]

FINDINGS: There is no evidence of hip fracture or dislocation. There is no
evidence of arthropathy or other focal bone abnormality.
IMPRESSION: Negative exam.

## 2019-12-31 ENCOUNTER — Ambulatory Visit: Payer: 59 | Admitting: Podiatry

## 2019-12-31 ENCOUNTER — Encounter: Payer: Self-pay | Admitting: Podiatry

## 2019-12-31 ENCOUNTER — Other Ambulatory Visit: Payer: Self-pay

## 2019-12-31 VITALS — Temp 97.3°F

## 2019-12-31 DIAGNOSIS — M7672 Peroneal tendinitis, left leg: Secondary | ICD-10-CM | POA: Diagnosis not present

## 2019-12-31 DIAGNOSIS — M79672 Pain in left foot: Secondary | ICD-10-CM | POA: Diagnosis not present

## 2020-01-01 NOTE — Progress Notes (Signed)
Subjective:   Patient ID: Charlotte Rice, female   DOB: 59 y.o.   MRN: [id number]   HPI Patient continues to experience a lot of discomfort in the outside the left foot and states that she is concerned about the pain she has   ROS      Objective:  Physical Exam  Neurovascular status intact with inflammation pain of the outside of the left foot base of fifth metatarsal which only responded short-term to medication utilized     Assessment:  Peroneal tendinitis left inflammation fluid noted     Plan:  H&P condition reviewed and at this point I have recommended utilization of boot in order to mobilize and take stress off the foot.  I then went ahead and discussed long-term [employer] orthotics and if were able to get her foot under control this will be used and if not we may need to consider MRI.  Patient will be off work for the next 2 weeks until we can see that she is improving

## 2020-01-05 ENCOUNTER — Encounter: Payer: Self-pay | Admitting: Podiatry

## 2020-01-14 ENCOUNTER — Ambulatory Visit: Payer: 59 | Admitting: Podiatry

## 2020-01-22 ENCOUNTER — Other Ambulatory Visit: Payer: Self-pay

## 2020-01-22 ENCOUNTER — Ambulatory Visit: Payer: 59 | Admitting: Podiatry

## 2020-01-22 ENCOUNTER — Encounter: Payer: Self-pay | Admitting: Podiatry

## 2020-01-22 DIAGNOSIS — M21619 Bunion of unspecified foot: Secondary | ICD-10-CM | POA: Diagnosis not present

## 2020-01-22 DIAGNOSIS — T148XXA Other injury of unspecified body region, initial encounter: Secondary | ICD-10-CM

## 2020-01-22 NOTE — Progress Notes (Signed)
Subjective:   Patient ID: Charlotte Rice, female   DOB: 59 y.o.   MRN: [id number]   HPI Patient states the outside of her left foot is still really bothering her and so far has not responded to the immobilization that we did at last visit   ROS      Objective:  Physical Exam  Neurovascular status intact with patient found to have inflammation pain of the peroneal tendon near its insertion into the base of fifth metatarsal that is not responded to immobilization injection ice therapy and anti-inflammatories along with bunion deformity left     Assessment:  Possibility for tear of the peroneal tendon left with inflammation with structural bunion also noted     Plan:  H&P reviewed condition and since were not responding conservatively I recommended MRI to try to rule out a tear of the peroneal tendon.  I reviewed this with her we will get the results I will see her back we will decide what else we may be able to do to help her at that point

## 2020-01-25 ENCOUNTER — Encounter: Payer: Self-pay | Admitting: Podiatry

## 2020-01-25 ENCOUNTER — Telehealth: Payer: Self-pay | Admitting: *Deleted

## 2020-01-25 DIAGNOSIS — T148XXA Other injury of unspecified body region, initial encounter: Secondary | ICD-10-CM

## 2020-01-25 DIAGNOSIS — M79672 Pain in left foot: Secondary | ICD-10-CM

## 2020-01-25 DIAGNOSIS — M21619 Bunion of unspecified foot: Secondary | ICD-10-CM

## 2020-01-26 NOTE — Telephone Encounter (Signed)
Charlotte Rice:  Her insurance does not require a Prior Authorization, but they do want [place] to send the last office note to their Claim's Department:  [phone] (Fax #)    Reference #:  [id number]  Thank you,  [name], CMA (AAMA)

## 2020-01-26 NOTE — Telephone Encounter (Signed)
Orders to K. Mavrakis, CMA for pre-cert, faxed to [facility].

## 2020-01-26 NOTE — Telephone Encounter (Deleted)
Faxed required clinicals to [facility].

## 2020-02-03 NOTE — Telephone Encounter (Signed)
Faxed Dr. Mellody Drown 01/22/2020 clinicals to [facility].

## 2020-02-04 ENCOUNTER — Encounter: Payer: Self-pay | Admitting: Podiatry

## 2020-02-06 ENCOUNTER — Other Ambulatory Visit: Payer: Self-pay

## 2020-02-06 ENCOUNTER — Ambulatory Visit
Admission: RE | Admit: 2020-02-06 | Discharge: 2020-02-06 | Disposition: A | Discharge status: Home / Self Care | Payer: 59 | Source: Ambulatory Visit | Attending: Podiatry | Admitting: Podiatry

## 2020-02-09 ENCOUNTER — Other Ambulatory Visit: Payer: 59

## 2020-02-16 ENCOUNTER — Ambulatory Visit: Payer: 59 | Admitting: Podiatry

## 2020-02-18 ENCOUNTER — Encounter: Payer: Self-pay | Admitting: Podiatry

## 2020-02-18 ENCOUNTER — Ambulatory Visit: Payer: 59 | Admitting: Podiatry

## 2020-02-18 ENCOUNTER — Other Ambulatory Visit: Payer: Self-pay

## 2020-02-18 VITALS — Temp 97.2°F

## 2020-02-18 DIAGNOSIS — M7671 Peroneal tendinitis, right leg: Secondary | ICD-10-CM

## 2020-02-18 DIAGNOSIS — T148XXA Other injury of unspecified body region, initial encounter: Secondary | ICD-10-CM

## 2020-02-18 DIAGNOSIS — M7672 Peroneal tendinitis, left leg: Secondary | ICD-10-CM

## 2020-02-19 ENCOUNTER — Telehealth: Payer: Self-pay | Admitting: *Deleted

## 2020-02-19 NOTE — Telephone Encounter (Signed)
-----   Message from Wallene Huh, DPM sent at 02/19/2020  7:26 AM EDT ----- Normal mri

## 2020-02-19 NOTE — Telephone Encounter (Signed)
Normal mri

## 2020-02-19 NOTE — Telephone Encounter (Signed)
Left message for pt to call for results  

## 2020-02-19 NOTE — Telephone Encounter (Signed)
Pt states she doesn't understand the message.

## 2020-02-19 NOTE — Telephone Encounter (Signed)
Left message informing pt that the MRI did not show any tears or fractures she does have tendonitis.

## 2020-02-19 NOTE — Progress Notes (Signed)
Subjective:   Patient ID: Charlotte Rice, female   DOB: 59 y.o.   MRN: [id number]   HPI Patient presents stating that it is somewhat improved but it seems best in my food and its best when I do not have to be on cement floors.  I feel like I need new orthotics that are more specific for this condition   ROS      Objective:  Physical Exam  Neuro vascular status intact with patient found to have diminished discomfort peroneal tendon she is been wearing her boot and I reviewed her MRI indicating no tear of the tendon currently.  Moderate bunion formation was noted     Assessment:  Peroneal tendinitis left that still present with improvement with immobilization structural bunion     Plan:  H&P reviewed both conditions and at this point I recommended a [employer] orthotic with valgus wedging to try to take stress off this area.  I went ahead today casted her for functional orthotics and patient will have these made tolerated the casting well and we will add valgus wedging with deep heel seat of approximately 15 mm.  Patient will be seen back to recheck

## 2020-03-17 ENCOUNTER — Ambulatory Visit (INDEPENDENT_AMBULATORY_CARE_PROVIDER_SITE_OTHER): Payer: 59 | Admitting: Orthotics

## 2020-03-17 ENCOUNTER — Encounter: Payer: Self-pay | Admitting: Podiatry

## 2020-03-17 ENCOUNTER — Other Ambulatory Visit: Payer: Self-pay

## 2020-03-17 DIAGNOSIS — M7672 Peroneal tendinitis, left leg: Secondary | ICD-10-CM

## 2020-03-17 DIAGNOSIS — M722 Plantar fascial fibromatosis: Secondary | ICD-10-CM

## 2020-03-17 NOTE — Progress Notes (Signed)
Patient came in today to pick up custom made foot orthotics.  The goals were accomplished and the patient reported no dissatisfaction with said orthotics.  Patient was advised of breakin period and how to report any issues. 

## 2020-04-20 ENCOUNTER — Encounter: Payer: Self-pay | Admitting: Internal Medicine

## 2021-03-26 HISTORY — PX: TRIGGER FINGER RELEASE: SHX641

## 2021-05-18 ENCOUNTER — Other Ambulatory Visit (HOSPITAL_COMMUNITY): Payer: Self-pay | Admitting: Adult Health

## 2021-05-18 DIAGNOSIS — Z1231 Encounter for screening mammogram for malignant neoplasm of breast: Secondary | ICD-10-CM

## 2021-05-24 ENCOUNTER — Other Ambulatory Visit: Payer: Self-pay

## 2021-05-24 ENCOUNTER — Ambulatory Visit (HOSPITAL_COMMUNITY)
Admission: RE | Admit: 2021-05-24 | Discharge: 2021-05-24 | Disposition: A | Discharge status: Home / Self Care | Payer: 59 | Source: Ambulatory Visit | Attending: Adult Health | Admitting: Adult Health

## 2021-05-24 DIAGNOSIS — Z1231 Encounter for screening mammogram for malignant neoplasm of breast: Secondary | ICD-10-CM | POA: Diagnosis present

## 2021-06-12 ENCOUNTER — Other Ambulatory Visit (HOSPITAL_COMMUNITY)
Admission: RE | Admit: 2021-06-12 | Discharge: 2021-06-12 | Disposition: A | Discharge status: Home / Self Care | Payer: 59 | Source: Ambulatory Visit | Attending: Obstetrics & Gynecology | Admitting: Obstetrics & Gynecology

## 2021-06-12 ENCOUNTER — Ambulatory Visit (INDEPENDENT_AMBULATORY_CARE_PROVIDER_SITE_OTHER): Payer: 59 | Admitting: Obstetrics & Gynecology

## 2021-06-12 ENCOUNTER — Other Ambulatory Visit: Payer: Self-pay

## 2021-06-12 ENCOUNTER — Encounter: Payer: Self-pay | Admitting: Obstetrics & Gynecology

## 2021-06-12 VITALS — BP 122/85 | HR 61 | Ht 60.0 in | Wt 190.0 lb

## 2021-06-12 DIAGNOSIS — Z1212 Encounter for screening for malignant neoplasm of rectum: Secondary | ICD-10-CM | POA: Diagnosis not present

## 2021-06-12 DIAGNOSIS — Z01419 Encounter for gynecological examination (general) (routine) without abnormal findings: Secondary | ICD-10-CM | POA: Diagnosis not present

## 2021-06-12 DIAGNOSIS — Z1211 Encounter for screening for malignant neoplasm of colon: Secondary | ICD-10-CM

## 2021-06-12 NOTE — Progress Notes (Signed)
Subjective:     Charlotte Rice is a 60 y.o. female here for a routine exam.  No LMP recorded. Patient is postmenopausal. G2P2 Birth Control Method:  menopause Menstrual Calendar(currently): amenorrhea  Current complaints: none.   Current acute medical issues:  none   Recent Gynecologic History No LMP recorded. Patient is postmenopausal. Last Pap: 2014,  normal Last mammogram: 6/22,  normal  Past Medical History:  Diagnosis Date   Arthritis    knees   Migraines    PONV (postoperative nausea and vomiting)    vertigo [id number]    Right knee meniscal tear    Superficial fungus infection of skin 10/28/2015   Wears glasses     Past Surgical History:  Procedure Laterality Date   CHOLECYSTECTOMY  1994   KNEE ARTHROSCOPY Right 1995   KNEE ARTHROSCOPY WITH MEDIAL MENISECTOMY Right 08/30/2015   Procedure: RIGHT KNEE ARTHROSCOPY / PARTIAL MEDIAL MENISCECTOMY  AND CHONDROPLASTY ;  Surgeon: Sydnee Cabal, MD;  Location: [facility];  Service: Orthopedics;  Laterality: Right;   SHOULDER OPEN ROTATOR CUFF REPAIR Right 2008   TUBAL LIGATION  07-30-2003    OB History     Gravida  2   Para  2   Term      Preterm      AB      Living  2      SAB      IAB      Ectopic      Multiple      Live Births  2           Social History   Socioeconomic History   Marital status: Married    Spouse name: Not on file   Number of children: Not on file   Years of education: Not on file   Highest education level: Not on file  Occupational History   Not on file  Tobacco Use   Smoking status: Never   Smokeless tobacco: Never  Vaping Use   Vaping Use: Never used  Substance and Sexual Activity   Alcohol use: No   Drug use: No   Sexual activity: Yes    Birth control/protection: Surgical  Other Topics Concern   Not on file  Social History Narrative   Not on file   Social Determinants of Health   Financial Resource Strain: Low Risk    Difficulty of Paying  Living Expenses: Not very hard  Food Insecurity: No Food Insecurity   Worried About Running Out of Food in the Last Year: Never true   Ran Out of Food in the Last Year: Never true  Transportation Needs: No Transportation Needs   Lack of Transportation (Medical): No   Lack of Transportation (Non-Medical): No  Physical Activity: Sufficiently Active   Days of Exercise per Week: 4 days   Minutes of Exercise per Session: 60 min  Stress: No Stress Concern Present   Feeling of Stress : Only a little  Social Connections: Engineer, building services of Communication with Friends and Family: More than three times a week   Frequency of Social Gatherings with Friends and Family: More than three times a week   Attends Religious Services: More than 4 times per year   Active Member of [employer] or Organizations: Yes   Attends Music therapist: More than 4 times per year   Marital Status: Married    Family History  Problem Relation Age of Onset   Cancer Mother  breast    Diabetes Father    Hyperlipidemia Father    Hypertension Father    Heart disease Sister      Current Outpatient Medications:    meclizine (ANTIVERT) 25 MG tablet, Take 25 mg by mouth 3 (three) times daily as needed for dizziness., Disp: , Rfl:    meloxicam (MOBIC) 15 MG tablet, Take 15 mg by mouth daily., Disp: , Rfl:    SUMAtriptan (IMITREX) 100 MG tablet, Take 1 tablet (100 mg total) by mouth every 2 (two) hours as needed for migraine. May repeat in 2 hours if headache persists or recurs., Disp: 10 tablet, Rfl: 1  Review of Systems  Review of Systems  Constitutional: Negative for fever, chills, weight loss, malaise/fatigue and diaphoresis.  HENT: Negative for hearing loss, ear pain, nosebleeds, congestion, sore throat, neck pain, tinnitus and ear discharge.   Eyes: Negative for blurred vision, double vision, photophobia, pain, discharge and redness.  Respiratory: Negative for cough, hemoptysis, sputum  production, shortness of breath, wheezing and stridor.   Cardiovascular: Negative for chest pain, palpitations, orthopnea, claudication, leg swelling and PND.  Gastrointestinal: negative for abdominal pain. Negative for heartburn, nausea, vomiting, diarrhea, constipation, blood in stool and melena.  Genitourinary: Negative for dysuria, urgency, frequency, hematuria and flank pain.  Musculoskeletal: Negative for myalgias, back pain, joint pain and falls.  Skin: Negative for itching and rash.  Neurological: Negative for dizziness, tingling, tremors, sensory change, speech change, focal weakness, seizures, loss of consciousness, weakness and headaches.  Endo/Heme/Allergies: Negative for environmental allergies and polydipsia. Does not bruise/bleed easily.  Psychiatric/Behavioral: Negative for depression, suicidal ideas, hallucinations, memory loss and substance abuse. The patient is not nervous/anxious and does not have insomnia.        Objective:  Blood pressure 122/85, pulse 61, height 5' (1.524 m), weight 190 lb (86.2 kg).   Physical Exam  Vitals reviewed. Constitutional: She is oriented to person, place, and time. She appears well-developed and well-nourished.  HENT:  Head: Normocephalic and atraumatic.        Right Ear: External ear normal.  Left Ear: External ear normal.  Nose: Nose normal.  Mouth/Throat: Oropharynx is clear and moist.  Eyes: Conjunctivae and EOM are normal. Pupils are equal, round, and reactive to light. Right eye exhibits no discharge. Left eye exhibits no discharge. No scleral icterus.  Neck: Normal range of motion. Neck supple. No tracheal deviation present. No thyromegaly present.  Cardiovascular: Normal rate, regular rhythm, normal heart sounds and intact distal pulses.  Exam reveals no gallop and no friction rub.   No murmur heard. Respiratory: Effort normal and breath sounds normal. No respiratory distress. She has no wheezes. She has no rales. She exhibits no  tenderness.  GI: Soft. Bowel sounds are normal. She exhibits no distension and no mass. There is no tenderness. There is no rebound and no guarding.  Genitourinary:  Breasts no masses skin changes or nipple changes bilaterally      Vulva is normal without lesions Vagina is pink moist without discharge Cervix normal in appearance and pap is done Uterus is normal size shape and contour Adnexa is negative with normal sized ovaries  {Rectal    hemoccult negative, normal tone, no masses  Musculoskeletal: Normal range of motion. She exhibits no edema and no tenderness.  Neurological: She is alert and oriented to person, place, and time. She has normal reflexes. She displays normal reflexes. No cranial nerve deficit. She exhibits normal muscle tone. Coordination normal.  Skin: Skin is warm  and dry. No rash noted. No erythema. No pallor.  Psychiatric: She has a normal mood and affect. Her behavior is normal. Judgment and thought content normal.       Medications Ordered at today's visit: No orders of the defined types were placed in this encounter.   Other orders placed at today's visit: No orders of the defined types were placed in this encounter.     Assessment:    Normal Gyn exam.    Plan:    Contraception: post menopausal status. Follow up in: 3 years.     Return in about 3 years (around 06/12/2024) for yearly.

## 2021-06-16 LAB — CYTOLOGY - PAP
Comment: NEGATIVE
Diagnosis: NEGATIVE
High risk HPV: NEGATIVE

## 2021-06-22 ENCOUNTER — Ambulatory Visit (INDEPENDENT_AMBULATORY_CARE_PROVIDER_SITE_OTHER): Payer: Self-pay | Admitting: *Deleted

## 2021-06-22 ENCOUNTER — Other Ambulatory Visit: Payer: Self-pay

## 2021-06-22 VITALS — Ht 60.0 in | Wt 191.4 lb

## 2021-06-22 DIAGNOSIS — Z1211 Encounter for screening for malignant neoplasm of colon: Secondary | ICD-10-CM

## 2021-06-22 MED ORDER — CLENPIQ 10-3.5-12 MG-GM -GM/160ML PO SOLN: 1.0000 | Freq: Once | ORAL | 0 refills | Status: AC

## 2021-06-22 NOTE — Patient Instructions (Signed)
[place]   Patient Name:  Charlotte Rice Date of procedure:  07/12/2021 Time to register at [facility]:  7:00 am Provider:  Dr. Gala Romney  Please notify us immediately if you are diabetic, take iron supplements, or if you are on coumadin or any blood thinners.  Please hold the following medications: n/a  Note: Do NOT refrigerate or freeze CLENPIQ. CLENPIQ is ready to drink. There is no need to add any other liquid or mix the medicine in the bottle before you start dosing.   07/11/2021-  1 Day prior to procedure:    CLEAR LIQUIDS ALL DAY--NO SOLID FOODS OR DAIRY PRODUCTS! See list of liquids that are allowed and items that are NOT allowed below.  Diabetic Medication Instructions:  n/a  You must drink plenty of CLEAR LIQUIDS starting before your bowel prep. It is important to stay adequately hydrated before, during, and after your bowel prep for the prep to work effectively!  At 4:00 PM Begin the prep as follows:    Drink one bottle of pre-mixed CLENPIQ right from the bottle.  Drink at least five (5) 8-ounce drinks of clear liquids of your choice within the next 5 hours  At 10:00 PM: Drink the second bottle of pre-mixed CLENPIQ right from the bottle.   Drink at least three (3) 8-ounce drinks of clear liquids of your choice within the next 3 hours before going to bed.  Continue clear liquids.    07/12/2021-  Day of Procedure:  Diabetic medications adjustments: n/a  You may take TYLENOL products.  Please continue your regular medications unless we have instructed you otherwise.    At 3 hours before procedure @ 5:00 am: Stop drinking all liquids, nothing by mouth at this point.   Please note, on the day of your procedure you MUST be accompanied by an adult who is willing to assume responsibility for you at time of discharge. If you do not have such person with you, your procedure will have to be rescheduled.                                                                                                                      Please leave ALL jewelry at home prior to coming to the hospital for your procedure.   *It is your responsibility to check with your insurance company for the benefits of coverage you have for this procedure. Unfortunately, not all insurance companies have benefits to cover all or part of these types of procedures. It is your responsibility to check your benefits, however we will be glad to assist you with any codes your insurance company may need.   Please note that most insurance companies will not cover a screening colonoscopy for people under the age of 61  For example, with some insurance companies you may have benefits for a screening colonoscopy, but if polyps are found the diagnosis will change and then you may have a deductible that will need to be met.  Please make sure you check your benefits for screening colonoscopy as well as a diagnostic colonoscopy.    CLEAR LIQUIDS: (NO RED or PURPLE) Water  Jello   Apple Juice  White Grape Juice   Kool-Aid Soft drinks  Banana popsicles Sports Drink  Black coffee (No cream or milk) Tea (No cream or milk)  Broth (fat free beef/chicken/vegetable)  Clear liquids allow you to see your fingers on the other side of the glass.  Be sure they are NOT RED or PURPLE in color, cloudy, but CLEAR.  Do Not Eat: Dairy products of any kind Cranberry juice Tomato or V8 Juice  Orange Juice   Grapefruit Juice Red Grape Juice Alcohol   Non-dairy creamer Solid foods like cereal, oatmeal, yogurt, fruits, vegetables, creamed soups, eggs, bread, etc   HELPFUL HINTS TO MAKE DRINKING EASIER: -Trying drinking through a straw. -If you become nauseated, try consuming smaller amounts or stretch out the time between glasses.  Stop for 30 minutes & slowly start back drinking.  Call our office with any questions or concerns at [phone].  Thank You,  Christ Kick, [place]

## 2021-06-22 NOTE — Progress Notes (Addendum)
Gastroenterology Pre-Procedure Review  Request Date: 06/22/2021 Requesting Physician: 10 year recall, Last TCS 05/30/2010 done by Dr. Gala Romney, hyperplastic polyp  PATIENT REVIEW QUESTIONS: The patient responded to the following health history questions as indicated:    1. Diabetes Melitis: no 2. Joint replacements in the past 12 months: no 3. Major health problems in the past 3 months: no 4. Has an artificial valve or MVP: no 5. Has a defibrillator: no 6. Has been advised in past to take antibiotics in advance of a procedure like teeth cleaning: no 7. Family history of colon cancer: no  8. Alcohol Use: no 9. Illicit drug Use: no 10. History of sleep apnea: no  11. History of coronary artery or other vascular stents placed within the last 12 months: no 12. History of any prior anesthesia complications: no 13. Body mass index is 37.38 kg/m.    MEDICATIONS & ALLERGIES:    Patient reports the following regarding taking any blood thinners:   Plavix? no Aspirin? no Coumadin? no Brilinta? no Xarelto? no Eliquis? no Pradaxa? no Savaysa? no Effient? no  Patient confirms/reports the following medications:  Current Outpatient Medications  Medication Sig Dispense Refill   meclizine (ANTIVERT) 25 MG tablet Take 25 mg by mouth as needed for dizziness.     meloxicam (MOBIC) 15 MG tablet Take 15 mg by mouth as needed.     SUMAtriptan (IMITREX) 100 MG tablet Take 1 tablet (100 mg total) by mouth every 2 (two) hours as needed for migraine. May repeat in 2 hours if headache persists or recurs. (Patient taking differently: Take 100 mg by mouth as needed for migraine. May repeat in 2 hours if headache persists or recurs.) 10 tablet 1   No current facility-administered medications for this visit.    Patient confirms/reports the following allergies:  No Known Allergies  No orders of the defined types were placed in this encounter.   AUTHORIZATION INFORMATION Primary Insurance: [facility],  [place] #:  [id number],  Group #: [id number] Pre-Cert / Auth required: Yes, approved online [id number] Pre-Cert / Josem Kaufmann #: [id number]  SCHEDULE INFORMATION: Procedure has been scheduled as follows:  Date: 07/12/2021, Time: 8:00  Location: [facility] with Dr. Gala Romney  This Gastroenterology Pre-Precedure Review Form is being routed to the following provider(s): Aliene Altes, PA-C

## 2021-06-24 NOTE — Progress Notes (Signed)
OK to schedule. ASA II 

## 2021-06-26 ENCOUNTER — Other Ambulatory Visit: Payer: Self-pay | Admitting: *Deleted

## 2021-07-12 ENCOUNTER — Ambulatory Visit (HOSPITAL_COMMUNITY)
Admission: RE | Admit: 2021-07-12 | Discharge: 2021-07-12 | Disposition: A | Discharge status: Home / Self Care | Payer: 59 | Attending: Internal Medicine | Admitting: Internal Medicine

## 2021-07-12 ENCOUNTER — Encounter (HOSPITAL_COMMUNITY): Payer: Self-pay | Admitting: Internal Medicine

## 2021-07-12 ENCOUNTER — Encounter (HOSPITAL_COMMUNITY)
Admission: RE | Disposition: A | Discharge status: Home / Self Care | Payer: Self-pay | Source: Home / Self Care | Attending: Internal Medicine

## 2021-07-12 ENCOUNTER — Other Ambulatory Visit: Payer: Self-pay

## 2021-07-12 DIAGNOSIS — Z1211 Encounter for screening for malignant neoplasm of colon: Secondary | ICD-10-CM | POA: Diagnosis not present

## 2021-07-12 DIAGNOSIS — Z9049 Acquired absence of other specified parts of digestive tract: Secondary | ICD-10-CM | POA: Diagnosis not present

## 2021-07-12 HISTORY — PX: COLONOSCOPY: SHX5424

## 2021-07-12 SURGERY — COLONOSCOPY
Anesthesia: Moderate Sedation

## 2021-07-12 MED ORDER — MIDAZOLAM HCL 5 MG/5ML IJ SOLN
INTRAMUSCULAR | Status: DC | PRN
  Administered 2021-07-12: 2 mg via INTRAVENOUS
  Administered 2021-07-12 (×2): 1 mg via INTRAVENOUS

## 2021-07-12 MED ORDER — ONDANSETRON HCL 4 MG/2ML IJ SOLN
INTRAMUSCULAR | Status: AC
  Filled 2021-07-12: qty 2

## 2021-07-12 MED ORDER — SODIUM CHLORIDE 0.9 % IV SOLN
INTRAVENOUS | Status: DC
  Administered 2021-07-12: 1000 mL via INTRAVENOUS

## 2021-07-12 MED ORDER — STERILE WATER FOR IRRIGATION IR SOLN
Status: DC | PRN
  Administered 2021-07-12: 200 mL

## 2021-07-12 MED ORDER — ONDANSETRON HCL 4 MG/2ML IJ SOLN
INTRAMUSCULAR | Status: DC | PRN
  Administered 2021-07-12: 4 mg via INTRAVENOUS

## 2021-07-12 MED ORDER — MEPERIDINE HCL 50 MG/ML IJ SOLN
INTRAMUSCULAR | Status: DC | PRN
  Administered 2021-07-12: 15 mg via INTRAVENOUS
  Administered 2021-07-12: 25 mg via INTRAVENOUS

## 2021-07-12 MED ORDER — MIDAZOLAM HCL 5 MG/5ML IJ SOLN
INTRAMUSCULAR | Status: AC
  Filled 2021-07-12: qty 10

## 2021-07-12 MED ORDER — MEPERIDINE HCL 50 MG/ML IJ SOLN
INTRAMUSCULAR | Status: AC
  Filled 2021-07-12: qty 1

## 2021-07-12 NOTE — Op Note (Signed)
[facility] Patient Name: Charlotte Rice Procedure Date: 07/12/2021 8:00 AM MRN: [id number] Date of Birth: [birth year] Attending MD: Norvel Richards , MD CSN: [id number] Age: 60 Admit Type: Outpatient Procedure:                Colonoscopy Indications:              Screening for colorectal malignant neoplasm Providers:                Norvel Richards, MD, Caprice Kluver, Kristine L.                            Risa Grill, Technician Referring MD:              Medicines:                Midazolam 4 mg IV, Meperidine 40 mg IV Complications:            No immediate complications. Estimated Blood Loss:     Estimated blood loss: none. Procedure:                Pre-Anesthesia Assessment:                           - Prior to the procedure, a History and Physical                            was performed, and patient medications and                            allergies were reviewed. The patient's tolerance of                            previous anesthesia was also reviewed. The risks                            and benefits of the procedure and the sedation                            options and risks were discussed with the patient.                            All questions were answered, and informed consent                            was obtained. Prior Anticoagulants: The patient has                            taken no previous anticoagulant or antiplatelet                            agents. ASA Grade Assessment: II - A patient with                            mild systemic disease. After reviewing the risks  and benefits, the patient was deemed in                            satisfactory condition to undergo the procedure.                           After obtaining informed consent, the colonoscope                            was passed under direct vision. Throughout the                            procedure, the patient's blood pressure, pulse, and                             oxygen saturations were monitored continuously. The                            [id number]) scope was introduced through                            the anus and advanced to the the cecum, identified                            by appendiceal orifice and ileocecal valve. The                            colonoscopy was performed without difficulty. The                            patient tolerated the procedure well. The quality                            of the bowel preparation was adequate. Scope In: 8:23:23 AM Scope Out: 8:33:19 AM Scope Withdrawal Time: 0 hours 6 minutes 24 seconds  Total Procedure Duration: 0 hours 9 minutes 56 seconds  Findings:      The perianal and digital rectal examinations were normal.      The colon (entire examined portion) appeared normal.      The retroflexed view of the distal rectum and anal verge was normal and       showed no anal or rectal abnormalities. Impression:               - The entire examined colon is normal.                           - No specimens collected. Moderate Sedation:      Moderate (conscious) sedation was administered by the endoscopy nurse       and supervised by the endoscopist. The following parameters were       monitored: oxygen saturation, heart rate, blood pressure, and response       to care. Recommendation:           - Patient has a contact number available for  emergencies. The signs and symptoms of potential                            delayed complications were discussed with the                            patient. Return to normal activities tomorrow.                            Written discharge instructions were provided to the                            patient.                           - Resume previous diet.                           - Continue present medications.                           - Repeat colonoscopy in 10 years for screening                             purposes.                           - Return to GI office (date not yet determined). Procedure Code(s):        --- Professional ---                           [phone], Colonoscopy, flexible; diagnostic, including                            collection of specimen(s) by brushing or washing,                            when performed (separate procedure) Diagnosis Code(s):        --- Professional ---                           Z12.11, Encounter for screening for malignant                            neoplasm of colon CPT copyright 2019 American Medical Association. All rights reserved. The codes documented in this report are preliminary and upon coder review may  be revised to meet current compliance requirements. Cristopher Estimable. Angila Wombles, MD Norvel Richards, MD 07/12/2021 8:41:04 AM This report has been signed electronically. Number of Addenda: 0

## 2021-07-12 NOTE — H&P (Signed)
$'@LOGO'i$ @   Primary Care Physician:  Ralph Leyden, FNP Primary Gastroenterologist:  Dr. Gala Romney  Pre-Procedure History & Physical: HPI:  Charlotte Rice is a 60 y.o. female is here for a screening colonoscopy.  No bowel symptoms.  No family history of colon cancer.  Negative colonoscopy 10 years ago (a small hyperplastic polyp)  Past Medical History:  Diagnosis Date   Arthritis    knees   Migraines    PONV (postoperative nausea and vomiting)    vertigo [id number]    Right knee meniscal tear    Superficial fungus infection of skin 10/28/2015   Wears glasses     Past Surgical History:  Procedure Laterality Date   CARPAL TUNNEL RELEASE Right 2017   CHOLECYSTECTOMY  11/26/1992   KNEE ARTHROSCOPY Right 11/26/1993   KNEE ARTHROSCOPY WITH MEDIAL MENISECTOMY Right 08/30/2015   Procedure: RIGHT KNEE ARTHROSCOPY / PARTIAL MEDIAL MENISCECTOMY  AND CHONDROPLASTY ;  Surgeon: Sydnee Cabal, MD;  Location: [place];  Service: Orthopedics;  Laterality: Right;   SHOULDER OPEN ROTATOR CUFF REPAIR Right 11/26/2006   TRIGGER FINGER RELEASE Right 03/2021   TUBAL LIGATION  07/30/2003    Prior to Admission medications   Medication Sig Start Date End Date Taking? Authorizing Provider  Biotin w/ Vitamins C & E (HAIR/SKIN/NAILS PO) Take 1 tablet by mouth daily with lunch.   Yes [provider]  meclizine (ANTIVERT) 25 MG tablet Take 25 mg by mouth as needed for dizziness.    [provider]  meloxicam (MOBIC) 15 MG tablet Take 15 mg by mouth daily as needed for pain.    [provider]  promethazine (PHENERGAN) 12.5 MG tablet Take 12.5 mg by mouth 2 (two) times daily as needed for nausea/vomiting. 06/23/21   [provider]  SUMAtriptan (IMITREX) 100 MG tablet Take 1 tablet (100 mg total) by mouth every 2 (two) hours as needed for migraine. May repeat in 2 hours if headache persists or recurs. 07/20/19   Alycia Rossetti, MD    Allergies as of  06/26/2021   (No Known Allergies)    Family History  Problem Relation Age of Onset   Cancer Mother        breast    Diabetes Father    Hyperlipidemia Father    Hypertension Father    Heart disease Sister     Social History   Socioeconomic History   Marital status: Married    Spouse name: Not on file   Number of children: Not on file   Years of education: Not on file   Highest education level: Not on file  Occupational History   Not on file  Tobacco Use   Smoking status: Never   Smokeless tobacco: Never  Vaping Use   Vaping Use: Never used  Substance and Sexual Activity   Alcohol use: No   Drug use: No   Sexual activity: Yes    Birth control/protection: Surgical  Other Topics Concern   Not on file  Social History Narrative   Not on file   Social Determinants of Health   Financial Resource Strain: Low Risk    Difficulty of Paying Living Expenses: Not very hard  Food Insecurity: No Food Insecurity   Worried About Running Out of Food in the Last Year: Never true   Ran Out of Food in the Last Year: Never true  Transportation Needs: No Transportation Needs   Lack of Transportation (Medical): No   Lack of Transportation (Non-Medical):  No  Physical Activity: Sufficiently Active   Days of Exercise per Week: 4 days   Minutes of Exercise per Session: 60 min  Stress: No Stress Concern Present   Feeling of Stress : Only a little  Social Connections: Engineer, building services of Communication with Friends and Family: More than three times a week   Frequency of Social Gatherings with Friends and Family: More than three times a week   Attends Religious Services: More than 4 times per year   Active Member of [employer] or Organizations: Yes   Attends Music therapist: More than 4 times per year   Marital Status: Married  Human resources officer Violence: Not At Risk   Fear of Current or Ex-Partner: No   Emotionally Abused: No   Physically Abused: No   Sexually  Abused: No    Review of Systems: See HPI, otherwise negative ROS  Physical Exam: BP 127/76   Pulse 63   Temp 98.4 F (36.9 C) (Oral)   Resp 20   Ht 5' (1.524 m)   Wt 86.2 kg   SpO2 97%   BMI 37.11 kg/m  General:   Alert,  Well-developed, well-nourished, pleasant and cooperative in NAD Lungs:  Clear throughout to auscultation.   No wheezes, crackles, or rhonchi. No acute distress. Heart:  Regular rate and rhythm; no murmurs, clicks, rubs,  or gallops. Abdomen:  Soft, nontender and nondistended. No masses, hepatosplenomegaly or hernias noted. Normal bowel sounds, without guarding, and without rebound.   Impression/Plan: Charlotte Rice is now here to undergo a screening colonoscopy.  Average risk screening examination.  Risks, benefits, limitations, imponderables and alternatives regarding colonoscopy have been reviewed with the patient. Questions have been answered. All parties agreeable.     Notice:  This dictation was prepared with Dragon dictation along with smaller phrase technology. Any transcriptional errors that result from this process are unintentional and may not be corrected upon review.

## 2021-07-12 NOTE — Discharge Instructions (Addendum)
  Colonoscopy Discharge Instructions  Read the instructions outlined below and refer to this sheet in the next few weeks. These discharge instructions provide you with general information on caring for yourself after you leave the hospital. Your doctor may also give you specific instructions. While your treatment has been planned according to the most current medical practices available, unavoidable complications occasionally occur. If you have any problems or questions after discharge, call Dr. Gala Romney at [phone]. ACTIVITY You may resume your regular activity, but move at a slower pace for the next 24 hours.  Take frequent rest periods for the next 24 hours.  Walking will help get rid of the air and reduce the bloated feeling in your belly (abdomen).  No driving for 24 hours (because of the medicine (anesthesia) used during the test).   Do not sign any important legal documents or operate any machinery for 24 hours (because of the anesthesia used during the test).  NUTRITION Drink plenty of fluids.  You may resume your normal diet as instructed by your doctor.  Begin with a light meal and progress to your normal diet. Heavy or fried foods are harder to digest and may make you feel sick to your stomach (nauseated).  Avoid alcoholic beverages for 24 hours or as instructed.  MEDICATIONS You may resume your normal medications unless your doctor tells you otherwise.  WHAT YOU CAN EXPECT TODAY Some feelings of bloating in the abdomen.  Passage of more gas than usual.  Spotting of blood in your stool or on the toilet paper.  IF YOU HAD POLYPS REMOVED DURING THE COLONOSCOPY: No aspirin products for 7 days or as instructed.  No alcohol for 7 days or as instructed.  Eat a soft diet for the next 24 hours.  FINDING OUT THE RESULTS OF YOUR TEST Not all test results are available during your visit. If your test results are not back during the visit, make an appointment with your caregiver to find out the  results. Do not assume everything is normal if you have not heard from your caregiver or the medical facility. It is important for you to follow up on all of your test results.  SEEK IMMEDIATE MEDICAL ATTENTION IF: You have more than a spotting of blood in your stool.  Your belly is swollen (abdominal distention).  You are nauseated or vomiting.  You have a temperature over 101.  You have abdominal pain or discomfort that is severe or gets worse throughout the day.     Your prep was excellent.  Your colonoscopy was normal today.  It is recommended you return in 10 years for 1 more screening examination  At patient request, I called Dayanna Inghram at [phone]  -unable to reach.

## 2021-07-21 ENCOUNTER — Encounter (HOSPITAL_COMMUNITY): Payer: Self-pay | Admitting: Internal Medicine

## 2022-02-26 ENCOUNTER — Ambulatory Visit: Payer: 59 | Admitting: Podiatry

## 2022-02-26 ENCOUNTER — Ambulatory Visit (INDEPENDENT_AMBULATORY_CARE_PROVIDER_SITE_OTHER): Payer: 59

## 2022-02-26 ENCOUNTER — Encounter: Payer: Self-pay | Admitting: Podiatry

## 2022-02-26 DIAGNOSIS — M7752 Other enthesopathy of left foot: Secondary | ICD-10-CM

## 2022-02-26 DIAGNOSIS — M779 Enthesopathy, unspecified: Secondary | ICD-10-CM

## 2022-02-26 DIAGNOSIS — M79672 Pain in left foot: Secondary | ICD-10-CM

## 2022-02-26 MED ORDER — TRIAMCINOLONE ACETONIDE 10 MG/ML IJ SUSP
10.0000 mg | Freq: Once | INTRAMUSCULAR | Status: AC
  Administered 2022-02-26: 10 mg

## 2022-02-26 NOTE — Progress Notes (Signed)
Subjective:  ? ?Patient ID: Charlotte Rice, female   DOB: 61 y.o.   MRN: [id number]  ? 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 ? ?ROS ? ? ?   ?Objective:  ?Physical Exam  ?Neurovascular status intact with inflammation pain of the first MPJ left with moderate bunion deformity also noted no restriction of motion no crepitus of the joint was noted ? ?   ?Assessment:  ?Inflammatory condition with capsulitis of the left first MPJ with patient who works long hours cement surfaces ? ?   ?Plan:  ?H&P reviewed long-term new orthotics and I want to visualize her old pair decide what would be best and today I went ahead and I did do sterile prep and I injected around the first MPJ and the left structural bunion site 3 mg dexamethasone Kenalog 5 mg Xylocaine ? ?X-rays indicate bunion deformity and does not indicate advanced arthritis of the joint surface ?   ? ? ?

## 2022-03-01 ENCOUNTER — Other Ambulatory Visit: Payer: Self-pay | Admitting: Podiatry

## 2022-03-01 DIAGNOSIS — M779 Enthesopathy, unspecified: Secondary | ICD-10-CM

## 2022-03-12 ENCOUNTER — Ambulatory Visit: Payer: 59 | Admitting: Podiatry

## 2022-03-12 ENCOUNTER — Encounter: Payer: Self-pay | Admitting: Podiatry

## 2022-03-12 DIAGNOSIS — M7752 Other enthesopathy of left foot: Secondary | ICD-10-CM | POA: Diagnosis not present

## 2022-03-12 NOTE — Progress Notes (Signed)
Subjective:  ? ?Patient ID: Charlotte Rice, female   DOB: 61 y.o.   MRN: [id number]  ? ?HPI ?Patient states she is improved but she would like the arthritis not to get worse in her foot ? ? ?ROS ? ? ?   ?Objective:  ?Physical Exam  ?Neurovascular status intact with inflammation around the first MPJ left that has improved but there is moderate reduction of joint motion and spurring dorsally ? ?   ?Assessment:  ?Hallux limitus deformity left improving but present ? ?   ?Plan:  ?Reviewed condition recommended continuation of conservative care and I do think an orthotic with a reverse Morton's extension with a deep heel seat would be best.  I recommended a Berkley type orthotic with reverse Morton's extension and patient is casted for these devices and will get them back to her as soon as we can to try to prevent a worsening of her structural condition ?   ? ? ?

## 2022-04-03 ENCOUNTER — Ambulatory Visit: Payer: 59

## 2022-04-03 DIAGNOSIS — M7752 Other enthesopathy of left foot: Secondary | ICD-10-CM

## 2022-04-03 NOTE — Progress Notes (Signed)
SITUATION: ?Reason for Visit: Fitting and Delivery of Custom Fabricated Foot Orthoses ?Patient Report: Patient reports comfort and is satisfied with device. ? ?OBJECTIVE DATA: ?Patient History / Diagnosis:   ?  ICD-10-CM   ?1. Capsulitis of metatarsophalangeal (MTP) joint of left foot  M77.52   ?  ? ? ?Provided Device:  Custom Functional Foot Orthotics ?    [employer]: [id number] ? ?GOAL OF ORTHOSIS ?- Improve gait ?- Decrease energy expenditure ?- Improve Balance ?- Provide Triplanar stability of foot complex ?- Facilitate motion ? ?ACTIONS PERFORMED ?Patient was fit with foot orthotics trimmed to shoe last. Patient tolerated fittign procedure.  ? ?Patient was provided with verbal and written instruction and demonstration regarding donning, doffing, wear, care, proper fit, function, purpose, cleaning, and use of the orthosis and in all related precautions and risks and benefits regarding the orthosis. ? ?Patient was also provided with verbal instruction regarding how to report any failures or malfunctions of the orthosis and necessary follow up care. Patient was also instructed to contact our office regarding any change in status that may affect the function of the orthosis. ? ?Patient demonstrated independence with proper donning, doffing, and fit and verbalized understanding of all instructions. ? ?PLAN: ?Patient is to follow up in one week or as necessary (PRN). All questions were answered and concerns addressed. Plan of care was discussed with and agreed upon by the patient. ? ?

## 2022-10-25 ENCOUNTER — Other Ambulatory Visit (HOSPITAL_COMMUNITY): Payer: Self-pay | Admitting: Family Medicine

## 2022-10-25 DIAGNOSIS — Z1231 Encounter for screening mammogram for malignant neoplasm of breast: Secondary | ICD-10-CM

## 2022-11-01 ENCOUNTER — Ambulatory Visit (HOSPITAL_COMMUNITY)
Admission: RE | Admit: 2022-11-01 | Discharge: 2022-11-01 | Disposition: A | Discharge status: Home / Self Care | Payer: 59 | Source: Ambulatory Visit | Attending: Family Medicine | Admitting: Family Medicine

## 2022-11-01 DIAGNOSIS — Z1231 Encounter for screening mammogram for malignant neoplasm of breast: Secondary | ICD-10-CM | POA: Insufficient documentation

## 2023-11-05 ENCOUNTER — Encounter: Payer: Self-pay | Admitting: Family Medicine

## 2023-12-11 ENCOUNTER — Other Ambulatory Visit (HOSPITAL_COMMUNITY): Payer: Self-pay | Admitting: Family Medicine

## 2023-12-11 DIAGNOSIS — Z1231 Encounter for screening mammogram for malignant neoplasm of breast: Secondary | ICD-10-CM

## 2023-12-16 ENCOUNTER — Ambulatory Visit (HOSPITAL_COMMUNITY)
Admission: RE | Admit: 2023-12-16 | Discharge: 2023-12-16 | Disposition: A | Discharge status: Home / Self Care | Payer: 59 | Source: Ambulatory Visit | Attending: Family Medicine | Admitting: Family Medicine

## 2023-12-16 DIAGNOSIS — Z1231 Encounter for screening mammogram for malignant neoplasm of breast: Secondary | ICD-10-CM | POA: Insufficient documentation
